# Patient Record
Sex: Male | Born: 2000 | Race: White | Hispanic: No | Marital: Married | Smoking: Never smoker
Health system: Southern US, Community
[De-identification: ages and names within clinical notes are randomized; demographics above are authoritative.]

## PROBLEM LIST (undated history)

## (undated) ENCOUNTER — Emergency Department (HOSPITAL_COMMUNITY): Admission: EM | Payer: No Typology Code available for payment source | Source: Home / Self Care

## (undated) DIAGNOSIS — F32A Depression, unspecified: Secondary | ICD-10-CM

## (undated) DIAGNOSIS — R1110 Vomiting, unspecified: Secondary | ICD-10-CM

## (undated) DIAGNOSIS — G479 Sleep disorder, unspecified: Secondary | ICD-10-CM

## (undated) DIAGNOSIS — F429 Obsessive-compulsive disorder, unspecified: Secondary | ICD-10-CM

## (undated) DIAGNOSIS — G40909 Epilepsy, unspecified, not intractable, without status epilepticus: Secondary | ICD-10-CM

## (undated) DIAGNOSIS — S060XAA Concussion with loss of consciousness status unknown, initial encounter: Secondary | ICD-10-CM

## (undated) DIAGNOSIS — S060X9A Concussion with loss of consciousness of unspecified duration, initial encounter: Secondary | ICD-10-CM

## (undated) DIAGNOSIS — F419 Anxiety disorder, unspecified: Secondary | ICD-10-CM

## (undated) HISTORY — PX: HAND SURGERY: SHX662

## (undated) HISTORY — PX: CIRCUMCISION: SUR203

## (undated) HISTORY — PX: WISDOM TOOTH EXTRACTION: SHX21

---
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
## 2019-03-08 DIAGNOSIS — G40309 Generalized idiopathic epilepsy and epileptic syndromes, not intractable, without status epilepticus: Secondary | ICD-10-CM | POA: Diagnosis present

## 2019-11-12 DIAGNOSIS — F32A Depression, unspecified: Secondary | ICD-10-CM

## 2020-01-05 DIAGNOSIS — F4322 Adjustment disorder with anxiety: Secondary | ICD-10-CM | POA: Insufficient documentation

## 2020-03-30 ENCOUNTER — Other Ambulatory Visit: Payer: Self-pay

## 2020-03-30 ENCOUNTER — Emergency Department (HOSPITAL_COMMUNITY)
Admission: EM | Admit: 2020-03-30 | Discharge: 2020-03-31 | Disposition: A | Discharge status: Home / Self Care | Payer: Medicaid Other | Attending: Emergency Medicine | Admitting: Emergency Medicine

## 2020-03-30 ENCOUNTER — Emergency Department (HOSPITAL_COMMUNITY): Payer: Medicaid Other

## 2020-03-30 DIAGNOSIS — Z79899 Other long term (current) drug therapy: Secondary | ICD-10-CM | POA: Diagnosis not present

## 2020-03-30 DIAGNOSIS — R4182 Altered mental status, unspecified: Secondary | ICD-10-CM | POA: Diagnosis present

## 2020-03-30 DIAGNOSIS — F10929 Alcohol use, unspecified with intoxication, unspecified: Secondary | ICD-10-CM

## 2020-03-30 DIAGNOSIS — R0681 Apnea, not elsewhere classified: Secondary | ICD-10-CM | POA: Diagnosis not present

## 2020-03-30 DIAGNOSIS — R569 Unspecified convulsions: Secondary | ICD-10-CM | POA: Insufficient documentation

## 2020-03-30 DIAGNOSIS — Y907 Blood alcohol level of 200-239 mg/100 ml: Secondary | ICD-10-CM | POA: Diagnosis not present

## 2020-03-30 DIAGNOSIS — F17200 Nicotine dependence, unspecified, uncomplicated: Secondary | ICD-10-CM | POA: Diagnosis not present

## 2020-03-30 LAB — CBC
HCT: 44.2 % (ref 39.0–52.0)
Hemoglobin: 15.1 g/dL (ref 13.0–17.0)
MCH: 31.5 pg (ref 26.0–34.0)
MCHC: 34.2 g/dL (ref 30.0–36.0)
MCV: 92.1 fL (ref 80.0–100.0)
Platelets: 180 10*3/uL (ref 150–400)
RBC: 4.8 MIL/uL (ref 4.22–5.81)
RDW: 11.2 % — ABNORMAL LOW (ref 11.5–15.5)
WBC: 5 10*3/uL (ref 4.0–10.5)
nRBC: 0 % (ref 0.0–0.2)

## 2020-03-30 LAB — COMPREHENSIVE METABOLIC PANEL
ALT: 40 U/L (ref 0–44)
AST: 47 U/L — ABNORMAL HIGH (ref 15–41)
Albumin: 4.3 g/dL (ref 3.5–5.0)
Alkaline Phosphatase: 76 U/L (ref 38–126)
Anion gap: 13 (ref 5–15)
BUN: 7 mg/dL (ref 6–20)
CO2: 25 mmol/L (ref 22–32)
Calcium: 8.7 mg/dL — ABNORMAL LOW (ref 8.9–10.3)
Chloride: 104 mmol/L (ref 98–111)
Creatinine, Ser: 1.05 mg/dL (ref 0.61–1.24)
GFR calc Af Amer: >60 mL/min (ref >60)
GFR calc non Af Amer: >60 mL/min (ref >60)
Glucose, Bld: 111 mg/dL — ABNORMAL HIGH (ref 70–99)
Potassium: 3.7 mmol/L (ref 3.5–5.1)
Sodium: 142 mmol/L (ref 135–145)
Total Bilirubin: 1 mg/dL (ref 0.3–1.2)
Total Protein: 6.4 g/dL — ABNORMAL LOW (ref 6.5–8.1)

## 2020-03-30 LAB — ETHANOL: Alcohol, Ethyl (B): 204 mg/dL — ABNORMAL HIGH (ref <10)

## 2020-03-30 LAB — CBG MONITORING, ED: Glucose-Capillary: 106 mg/dL — ABNORMAL HIGH (ref 70–99)

## 2020-03-30 LAB — VALPROIC ACID LEVEL: Valproic Acid Lvl: 47 ug/mL — ABNORMAL LOW (ref 50.0–100.0)

## 2020-03-30 MED ORDER — SODIUM CHLORIDE 0.9 % IV SOLN: INTRAVENOUS | Status: DC

## 2020-03-30 MED ORDER — LORAZEPAM 2 MG/ML IJ SOLN
INTRAMUSCULAR | Status: AC
  Filled 2020-03-30: qty 1

## 2020-03-30 MED ORDER — ONDANSETRON HCL 4 MG/2ML IJ SOLN
4.0000 mg | Freq: Once | INTRAMUSCULAR | Status: AC
  Administered 2020-03-30: 4 mg via INTRAVENOUS

## 2020-03-30 NOTE — ED Provider Notes (Signed)
[facility] EMERGENCY DEPARTMENT Provider Note   CSN: [id number] Arrival date & time: 03/30/20  2054     History seizure  Albert Gomez is a 19 y.o. male.  HPI   Patient presented to the ED for evaluation of altered mental status and seizures.  Patient has a history of epilepsy, obsessive-compulsive disorder and anxiety.  Per the EMS report, the patient had been drinking some alcohol today.  Apparently when he got home this evening he started seizing.  Apparently this occurred for approximately 45 minutes and there was interval lucidity.  EMS was called because of persistent seizures.  Patient was given 5 mg of Versed.  In the ED he had an episode of vomiting but otherwise has not been able to provide any history.  Past Medical History:  Diagnosis Date  . Anxiety   . Concussion   . Epilepsy (HCC)   . Obsessive-compulsive disorder   . Sleep disorder   . Vomiting     Patient Active Problem List   Diagnosis Date Noted  . Seizure (HCC) 06/03/2018  . Vomiting 06/03/2018  . Elevated lipase   . Acute appendicitis, uncomplicated 11/26/2016  . Problems with learning 09/14/2016  . Insomnia 09/14/2016  . 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 Transient alteration of awareness 02/13/2015  . Anxiety state 02/13/2015    Past Surgical History:  Procedure Laterality Date  . CIRCUMCISION  2001  . HAND SURGERY    . LAPAROSCOPIC APPENDECTOMY N/A 11/26/2016   Procedure: APPENDECTOMY LAPAROSCOPIC;  Surgeon: Kandice Hams, MD;  Location: [facility] OR;  Service: General;  Laterality: N/A;       Family History  Problem Relation Age of Onset  . Lung cancer Paternal Grandmother        Died at 51    Social History   Tobacco Use  . Smoking status: Current Every Day Smoker  . Smokeless tobacco: Never Used  Vaping Use  . Vaping Use: Never assessed  Substance Use Topics  .  Alcohol use: Yes    Alcohol/week: 0.0 standard drinks  . Drug use: Yes    Types: Marijuana    Home Medications Prior to Admission medications   Medication Sig Start Date End Date Taking? Authorizing Provider  ibuprofen (ADVIL,MOTRIN) 400 MG tablet Take 1 tablet (400 mg total) by mouth every 6 (six) hours as needed for moderate pain. 06/04/18   Collene Gobble I, MD  levETIRAcetam (KEPPRA) 250 MG tablet Take 250 mg by mouth 3 (three) times daily. 05/07/18   [provider]  ondansetron (ZOFRAN ODT) 4 MG disintegrating tablet Take 1 tablet (4 mg total) by mouth every 8 (eight) hours as needed for nausea or vomiting. 06/04/18   Collene Gobble I, MD    Allergies    Patient has no known allergies.  Review of Systems   Review of Systems  All other systems reviewed and are negative.   Physical Exam Updated Vital Signs BP (!) 120/39   Pulse 73   Resp 18   SpO2 100%   Physical Exam Vitals and nursing note reviewed.  Constitutional:      General: He is not in acute distress.    Appearance: He is well-developed.  HENT:     Head: Normocephalic and atraumatic.     Right Ear: External ear normal.     Left Ear: External ear normal.  Eyes:     General: No scleral  icterus.       Right eye: No discharge.        Left eye: No discharge.     Conjunctiva/sclera: Conjunctivae normal.  Neck:     Trachea: No tracheal deviation.  Cardiovascular:     Rate and Rhythm: Normal rate and regular rhythm.  Pulmonary:     Effort: Pulmonary effort is normal. No respiratory distress.     Breath sounds: Normal breath sounds. No stridor. No wheezing or rales.  Abdominal:     General: Bowel sounds are normal. There is no distension.     Palpations: Abdomen is soft.     Tenderness: There is no abdominal tenderness. There is no guarding or rebound.  Musculoskeletal:        General: No tenderness.     Cervical back: Neck supple.  Skin:    General: Skin is warm and dry.     Findings: No rash.    Neurological:     Cranial Nerves: No cranial nerve deficit (no facial droop, extraocular movements intact,  ).     Motor: No abnormal muscle tone or seizure activity.     Comments: Patient was not actively seizing during my evaluation, he did look at me, would not answer any questions appeared to be hyperventilating     ED Results / Procedures / Treatments   Labs (all labs ordered are listed, but only abnormal results are displayed) Labs Reviewed  COMPREHENSIVE METABOLIC PANEL - Abnormal; Notable for the following components:      Result Value   Glucose, Bld 111 (*)    Calcium 8.7 (*)    Total Protein 6.4 (*)    AST 47 (*)    All other components within normal limits  CBC - Abnormal; Notable for the following components:   RDW 11.2 (*)    All other components within normal limits  VALPROIC ACID LEVEL - Abnormal; Notable for the following components:   Valproic Acid Lvl 47 (*)    All other components within normal limits  ETHANOL - Abnormal; Notable for the following components:   Alcohol, Ethyl (B) 204 (*)    All other components within normal limits  CBG MONITORING, ED - Abnormal; Notable for the following components:   Glucose-Capillary 106 (*)    All other components within normal limits  LEVETIRACETAM LEVEL  RAPID URINE DRUG SCREEN, HOSP PERFORMED    EKG None  Radiology CT Head Wo Contrast  Result Date: 03/30/2020 CLINICAL DATA:  19 year old male with seizures. EXAM: CT HEAD WITHOUT CONTRAST TECHNIQUE: Contiguous axial images were obtained from the base of the skull through the vertex without intravenous contrast. COMPARISON:  None. FINDINGS: Brain: No evidence of acute infarction, hemorrhage, hydrocephalus, extra-axial collection or mass lesion/mass effect. Vascular: No hyperdense vessel or unexpected calcification. Skull: Normal. Negative for fracture or focal lesion. Sinuses/Orbits: There is a small left maxillary sinus retention cyst or polyp. The remainder of the  visualized paranasal sinuses and mastoid air cells are clear. Other: None IMPRESSION: Normal noncontrast CT of the brain. Electronically Signed   By: Elgie Collard M.D.   On: 03/30/2020 22:19    Procedures Procedures (including critical care time)  Medications Ordered in ED Medications  0.9 %  sodium chloride infusion ( Intravenous New Bag/Given 03/30/20 2130)  LORazepam (ATIVAN) 2 MG/ML injection (  Given 03/30/20 2115)  ondansetron (ZOFRAN) injection 4 mg (4 mg Intravenous Given 03/30/20 2134)    ED Course  I have reviewed the triage vital signs and  the nursing notes.  Pertinent labs & imaging results that were available during my care of the patient were reviewed by me and considered in my medical decision making (see chart for details).  Clinical Course as of Mar 30 2348  Mon Mar 30, 2020  2115 Patient has had periods of apnea (?intentional) followed by episodes of hyperventilation and tachypnea.  O2 saturation has remained 100%.  We will continue to monitor closely   [JK]  2147 Patient is now awake and alert.  Is complaining of nausea vomiting.  Additional Zofran given   [JK]  2244 Head CT without acute findings.   [JK]    Clinical Course User Index [JK] Linwood Dibbles, MD   MDM Rules/Calculators/A&P                          Patient presented to the ED for evaluation of possible seizures.  Patient does have a history of seizure disorder.  In the ED the patient had episodes of nausea vomiting and unresponsiveness.  CT without acute findings.  Significant alcohol intoxication noted.  Likely contributed to sx tonight.  Will monitor until pt is sober.  No signs of recurrent seizure at this time.  Dr Wilkie Aye will follow up on pt. Final Clinical Impression(s) / ED Diagnoses Final diagnoses:  Alcoholic intoxication with complication [facility])    Rx / DC Orders ED Discharge Orders    None       Linwood Dibbles, MD 03/30/20 2349

## 2020-03-30 NOTE — ED Triage Notes (Addendum)
Pt coming in by EMS after having multiple seizures lasting over a span of 45 min. Had admitted to ETOH use today, 2-3 beers with friends [place]. Has hx of seizures and takes meds as prescribed per pt. Given 5mg  of Midazolam by EMS. After administration pt had a period of apnea for approx 10-15 min and had to be bagged. Vitals WDL. [place] and had a tonic clonic seizure not long after arriving to room. Pt originally alert and oriented x4 before having another seizure

## 2020-03-30 NOTE — ED Notes (Signed)
Pt still sleeping comfortably.

## 2020-03-30 NOTE — ED Notes (Signed)
Updated pt's mother again over phone.

## 2020-03-30 NOTE — ED Notes (Addendum)
Pt alert and oriented and developed nausea again and hiccups. MD informed and pt given Zofran. Pt then fell asleep but was unresponsive to painful stimuli. Pupils assessed and glaze deviation noted in both pupils. Pupils now pinpoint. MD informed and given verbal orders to continue to monitor

## 2020-03-30 NOTE — ED Notes (Signed)
Upon arrival pt presented seizure like activity. Given Ativan and then developed a period of apnea. Bagged for approx 3 minutes and then pt began breathing on his own.

## 2020-03-30 NOTE — ED Notes (Signed)
Pt returned from CT. Pt still not responding to verbal stimuli and appears to be sleeping. Vitals WDL

## 2020-03-30 NOTE — ED Notes (Signed)
Updated pt's mother over the phone with pt's approval. Pt states he does not wish to have visitors at the moment

## 2020-03-31 LAB — RAPID URINE DRUG SCREEN, HOSP PERFORMED
Amphetamines: NOT DETECTED
Barbiturates: NOT DETECTED
Benzodiazepines: POSITIVE — AB
Cocaine: NOT DETECTED
Opiates: NOT DETECTED
Tetrahydrocannabinol: NOT DETECTED

## 2020-03-31 MED ORDER — ONDANSETRON HCL 4 MG/2ML IJ SOLN
4.0000 mg | Freq: Once | INTRAMUSCULAR | Status: AC
  Administered 2020-03-31: 4 mg via INTRAVENOUS
  Filled 2020-03-31: qty 2

## 2020-03-31 NOTE — ED Notes (Addendum)
Pt woke up and began vomiting and stated that he did not feel any better. Given Zofran. Pt now asleep in bed. Pt still requesting not to have any visitors back

## 2020-03-31 NOTE — Discharge Instructions (Addendum)
Your seen today for seizure activity.  You were found to be intoxicated.  Intoxication will lower your seizure threshold.  Make sure to take your seizure medications as prescribed.  Avoid excessive drug and alcohol use.

## 2020-03-31 NOTE — ED Provider Notes (Signed)
Patient signed out pending reassessment.  In brief, history of seizure disorder.  Seized earlier this evening.  This is in the setting of heavy alcohol use.  Alcohol level 204.  Reported to have several episodes while in the room of what appeared to be breath-holding spells.  However, vital signs stated stable.  On my recheck, he is awake, alert, oriented.  Reports he generally feels bad but his vital signs have remained normal.  He is ambulatory independently.  There was a message from the secretary that his mother wanted an update.  However, patient requested that his mother not be allowed back.  He is now an adult.  Information was not shared with his mother.  Patient encouraged to abstain from alcohol and drug use.  Continue seizure medications as prescribed.  Clinical Course as of Apr 01 303  Mon Mar 30, 2020  2115 Patient has had periods of apnea (?intentional) followed by episodes of hyperventilation and tachypnea.  O2 saturation has remained 100%.  We will continue to monitor closely   [JK]  2147 Patient is now awake and alert.  Is complaining of nausea vomiting.  Additional Zofran given   [JK]  2244 Head CT without acute findings.   [JK]  Tue Mar 31, 2020  0112 Patient is now awake, alert.  States he feels "bad."  Vital signs are reassuring.  Will normalize and ambulate.   [CH]    Clinical Course User Index [CH] Wilbon Obenchain, Mayer Masker, MD [JK] Linwood Dibbles, MD    After history, exam, and medical workup I feel the patient has been appropriately medically screened and is safe for discharge home. Pertinent diagnoses were discussed with the patient. Patient was given return precautions.  Problem List Items Addressed This Visit      Other   Seizure [facility])    Other Visit Diagnoses    Alcoholic intoxication with complication [facility])    -  Primary        Jarmarcus Wambold, Mayer Masker, MD 03/31/20 [phone]

## 2020-03-31 NOTE — ED Notes (Signed)
Pt woke up for a second and started shivering, asked if he was cold and he shook his head "yes". Given patient another blanket and will continue to monitor

## 2020-03-31 NOTE — ED Notes (Signed)
Called pt's mother Eber Jones again for transport home, no answer. Pt still resting

## 2020-03-31 NOTE — ED Notes (Signed)
Pt alert and oriented, walked around room. Attempted to call mom but she did not pick up the phone. Will attempt again soon.

## 2020-03-31 NOTE — ED Notes (Signed)
Called mother, Eber Jones. No answer

## 2020-03-31 NOTE — ED Notes (Signed)
Please call mom Albert Gomez @ [phone]--Albert Gomez

## 2020-03-31 NOTE — ED Notes (Signed)
Called father and stepmother for transport home, no answer

## 2020-03-31 NOTE — ED Notes (Signed)
Patient verbalizes understanding of discharge instructions. Opportunity for questioning and answers were provided. Armband removed by staff, pt discharged from ED. Wheeled out to lobby  

## 2020-03-31 NOTE — ED Notes (Signed)
Spoke w/pt's mother, and she is on the way to pick him up

## 2020-04-05 LAB — LEVETIRACETAM LEVEL: Levetiracetam Lvl: <1 ug/mL — ABNORMAL LOW (ref 10.0–40.0)

## 2020-11-24 ENCOUNTER — Other Ambulatory Visit: Payer: Self-pay

## 2020-11-24 ENCOUNTER — Encounter (HOSPITAL_COMMUNITY): Payer: Self-pay | Admitting: Registered Nurse

## 2020-11-24 ENCOUNTER — Emergency Department (HOSPITAL_COMMUNITY)
Admission: EM | Admit: 2020-11-24 | Discharge: 2020-11-25 | Disposition: A | Discharge status: Home / Self Care | Payer: Medicaid Other | Attending: Psychiatry | Admitting: Psychiatry

## 2020-11-24 ENCOUNTER — Encounter (HOSPITAL_COMMUNITY): Payer: Self-pay | Admitting: Emergency Medicine

## 2020-11-24 ENCOUNTER — Ambulatory Visit (HOSPITAL_COMMUNITY)
Admission: EM | Admit: 2020-11-24 | Discharge: 2020-11-24 | Disposition: A | Discharge status: Other Acute Inpatient Hospital | Payer: Medicaid Other | Attending: Psychiatry | Admitting: Psychiatry

## 2020-11-24 DIAGNOSIS — F341 Dysthymic disorder: Secondary | ICD-10-CM | POA: Insufficient documentation

## 2020-11-24 DIAGNOSIS — R45851 Suicidal ideations: Secondary | ICD-10-CM | POA: Insufficient documentation

## 2020-11-24 DIAGNOSIS — F332 Major depressive disorder, recurrent severe without psychotic features: Secondary | ICD-10-CM | POA: Diagnosis not present

## 2020-11-24 DIAGNOSIS — Z20822 Contact with and (suspected) exposure to covid-19: Secondary | ICD-10-CM | POA: Diagnosis not present

## 2020-11-24 DIAGNOSIS — Z79899 Other long term (current) drug therapy: Secondary | ICD-10-CM | POA: Insufficient documentation

## 2020-11-24 DIAGNOSIS — R569 Unspecified convulsions: Secondary | ICD-10-CM | POA: Diagnosis not present

## 2020-11-24 DIAGNOSIS — F329 Major depressive disorder, single episode, unspecified: Secondary | ICD-10-CM | POA: Diagnosis present

## 2020-11-24 DIAGNOSIS — F17200 Nicotine dependence, unspecified, uncomplicated: Secondary | ICD-10-CM | POA: Insufficient documentation

## 2020-11-24 DIAGNOSIS — F322 Major depressive disorder, single episode, severe without psychotic features: Secondary | ICD-10-CM | POA: Diagnosis not present

## 2020-11-24 LAB — COMPREHENSIVE METABOLIC PANEL
ALT: 29 U/L (ref 0–44)
AST: 29 U/L (ref 15–41)
Albumin: 4.7 g/dL (ref 3.5–5.0)
Alkaline Phosphatase: 64 U/L (ref 38–126)
Anion gap: 11 (ref 5–15)
BUN: 13 mg/dL (ref 6–20)
CO2: 29 mmol/L (ref 22–32)
Calcium: 9.9 mg/dL (ref 8.9–10.3)
Chloride: 100 mmol/L (ref 98–111)
Creatinine, Ser: 1.01 mg/dL (ref 0.61–1.24)
GFR, Estimated: >60 mL/min (ref >60)
Glucose, Bld: 95 mg/dL (ref 70–99)
Potassium: 3.8 mmol/L (ref 3.5–5.1)
Sodium: 140 mmol/L (ref 135–145)
Total Bilirubin: 0.5 mg/dL (ref 0.3–1.2)
Total Protein: 7.1 g/dL (ref 6.5–8.1)

## 2020-11-24 LAB — CBC
HCT: 45.7 % (ref 39.0–52.0)
Hemoglobin: 16.4 g/dL (ref 13.0–17.0)
MCH: 31.8 pg (ref 26.0–34.0)
MCHC: 35.9 g/dL (ref 30.0–36.0)
MCV: 88.6 fL (ref 80.0–100.0)
Platelets: 193 10*3/uL (ref 150–400)
RBC: 5.16 MIL/uL (ref 4.22–5.81)
RDW: 11 % — ABNORMAL LOW (ref 11.5–15.5)
WBC: 7.4 10*3/uL (ref 4.0–10.5)
nRBC: 0 % (ref 0.0–0.2)

## 2020-11-24 LAB — ETHANOL: Alcohol, Ethyl (B): <10 mg/dL (ref <10)

## 2020-11-24 LAB — RAPID URINE DRUG SCREEN, HOSP PERFORMED
Amphetamines: NOT DETECTED
Barbiturates: NOT DETECTED
Benzodiazepines: POSITIVE — AB
Cocaine: NOT DETECTED
Opiates: NOT DETECTED
Tetrahydrocannabinol: POSITIVE — AB

## 2020-11-24 LAB — SALICYLATE LEVEL: Salicylate Lvl: <7 mg/dL — ABNORMAL LOW (ref 7.0–30.0)

## 2020-11-24 LAB — ACETAMINOPHEN LEVEL: Acetaminophen (Tylenol), Serum: <10 ug/mL — ABNORMAL LOW (ref 10–30)

## 2020-11-24 MED ORDER — DIVALPROEX SODIUM 250 MG PO DR TAB: 500.0000 mg | DELAYED_RELEASE_TABLET | Freq: Two times a day (BID) | ORAL | Status: DC

## 2020-11-24 MED ORDER — TRAZODONE HCL 100 MG PO TABS
100.0000 mg | ORAL_TABLET | Freq: Every evening | ORAL | Status: DC | PRN
  Administered 2020-11-25: 100 mg via ORAL
  Filled 2020-11-24: qty 1

## 2020-11-24 MED ORDER — CLOBAZAM 10 MG PO TABS
20.0000 mg | ORAL_TABLET | Freq: Two times a day (BID) | ORAL | Status: DC
  Administered 2020-11-25: 20 mg via ORAL
  Filled 2020-11-24: qty 2

## 2020-11-24 MED ORDER — DIVALPROEX SODIUM 250 MG PO DR TAB
500.0000 mg | DELAYED_RELEASE_TABLET | Freq: Two times a day (BID) | ORAL | Status: DC
  Administered 2020-11-25: 500 mg via ORAL
  Filled 2020-11-24: qty 2

## 2020-11-24 NOTE — BH Assessment (Addendum)
Patient is a 20 y.o. male with a history of significant untreated depression who presents at the recommendation of his neurologist.  Patient played football in high school and sustained a head injury/concussion and was diagnosed with epilepsy shortly after.  He has been battling depression since this injury 6 years ago, however he has kept this from family, friends and providers.  He reports having suicidal thoughts constantly, that worsen at night.  He has a prior well planned interrupted attempt 1.5 years ago.  He drove to the beach to clear his mind, with hopes this would help.  When there was no relief from symptoms, he bought a rope at [employer] and found a tower.  Just before the attempt, he received a call/text that interrupted the attempt as he saw this as a sign.  He has continued to experience "unbearable" depressive symptoms daily.  He states he thinks of attempting each night, and he worries that he may attempt.  He has been looking into  Medications to overdose on for the least painful option. Patient divulged this information with SW at his neurology clinic, who then recommended pt come in ASAP for assessment.  Patient is unable to reliably affirm his safety at this time.   Patient is EMERGENT

## 2020-11-24 NOTE — ED Notes (Signed)
Rayfield Citizen, mom, [phone] would like an update when available

## 2020-11-24 NOTE — ED Notes (Signed)
Patient wanded by security-[place],RN

## 2020-11-24 NOTE — ED Notes (Signed)
Pt was transported to [facility] via [facility]. Continue to endorse SI. No seizure noted. Safety maintained.

## 2020-11-24 NOTE — ED Notes (Signed)
Called mother Rayfield Citizen and updated her regarding pts plan of care with pts consent.

## 2020-11-24 NOTE — ED Notes (Signed)
Called Clair Gulling, Consulting civil engineer at [employer] with nurse-to-nurse report. Called Non-emergent EMS for transport due to pt being seizure prone and SI with plan. Pt made aware.

## 2020-11-24 NOTE — ED Provider Notes (Signed)
[place] EMERGENCY DEPARTMENT Provider Note   CSN: [id number] Arrival date & time: 11/24/20  1858     History Chief Complaint  Patient presents with  . Medical Clearance    Albert Gomez is a 20 y.o. male.  20 yo M with a chief complaints of depression. Patient went to the behavioral health urgent care and was told that he could not stay there because he has a history of seizures and so was sent here for evaluation. I did feel like he met inpatient criteria. Patient denies any recent issues with his seizures. Denies any missed doses of his medicines. Denies any medical complaint denies cough congestion or fever denies abdominal pain nausea vomiting or diarrhea.  The history is provided by the patient.  Illness Severity:  Moderate Onset quality:  Gradual Duration:  2 days Timing:  Constant Progression:  Worsening Chronicity:  Recurrent Associated symptoms: no abdominal pain, no chest pain, no congestion, no diarrhea, no fever, no headaches, no myalgias, no rash, no shortness of breath and no vomiting        Past Medical History:  Diagnosis Date  . Anxiety   . Concussion   . Epilepsy ([place])   . Obsessive-compulsive disorder   . Sleep disorder   . Vomiting     Patient Active Problem List   Diagnosis Date Noted  . 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 Suicidal ideation 11/24/2020  . Seizure ([place]) 06/03/2018  . Vomiting 06/03/2018  . Elevated lipase   . Acute appendicitis, uncomplicated 60/63/0160  . Problems with learning 09/14/2016  . Insomnia 09/14/2016  . 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 Transient alteration of awareness 02/13/2015  . Anxiety state 02/13/2015    Past Surgical History:  Procedure Laterality Date  . CIRCUMCISION  2001  . HAND SURGERY    . LAPAROSCOPIC APPENDECTOMY N/A  11/26/2016   Procedure: APPENDECTOMY LAPAROSCOPIC;  Surgeon: Stanford Scotland, MD;  Location: [facility] OR;  Service: General;  Laterality: N/A;       Family History  Problem Relation Age of Onset  . Lung cancer Paternal Grandmother        Died at 65    Social History   Tobacco Use  . Smoking status: Current Every Day Smoker  . Smokeless tobacco: Never Used  Substance Use Topics  . Alcohol use: Yes    Alcohol/week: 0.0 standard drinks  . Drug use: Yes    Types: Marijuana    Home Medications Prior to Admission medications   Medication Sig Start Date End Date Taking? Authorizing Provider  cloBAZam (ONFI) 20 MG tablet Take by mouth 2 (two) times daily.   Yes [provider]  divalproex (DEPAKOTE) 500 MG DR tablet Take 500 mg by mouth 2 (two) times daily.   Yes [provider]  traZODone (DESYREL) 50 MG tablet Take 100 mg by mouth at bedtime as needed for sleep.   Yes [provider]    Allergies    Patient has no known allergies.  Review of Systems   Review of Systems  Constitutional: Negative for chills and fever.  HENT: Negative for congestion and facial swelling.   Eyes: Negative for discharge and visual disturbance.  Respiratory: Negative for shortness of breath.   Cardiovascular: Negative for chest pain and palpitations.  Gastrointestinal: Negative for abdominal pain, diarrhea and vomiting.  Musculoskeletal: Negative for arthralgias and myalgias.  Skin: Negative for color change and rash.  Neurological: Negative for tremors, syncope and headaches.  Psychiatric/Behavioral: Positive for dysphoric mood. Negative for confusion.    Physical Exam Updated Vital Signs BP (!) 130/56 (BP Location: Left Arm)   Pulse 92   Temp 98.9 F (37.2 C) (Oral)   Resp 16   SpO2 99%   Physical Exam Vitals and nursing note reviewed.  Constitutional:      Appearance: He is well-developed and well-nourished.  HENT:     Head: Normocephalic and atraumatic.  Eyes:      Extraocular Movements: EOM normal.     Pupils: Pupils are equal, round, and reactive to light.  Neck:     Vascular: No JVD.  Cardiovascular:     Rate and Rhythm: Normal rate and regular rhythm.     Heart sounds: No murmur heard. No friction rub. No gallop.   Pulmonary:     Effort: No respiratory distress.     Breath sounds: No wheezing.  Abdominal:     General: There is no distension.     Tenderness: There is no guarding or rebound.  Musculoskeletal:        General: Normal range of motion.     Cervical back: Normal range of motion and neck supple.  Skin:    Coloration: Skin is not pale.     Findings: No rash.  Neurological:     Mental Status: He is alert and oriented to person, place, and time.  Psychiatric:        Mood and Affect: Mood is depressed.        Behavior: Behavior normal.     ED Results / Procedures / Treatments   Labs (all labs ordered are listed, but only abnormal results are displayed) Labs Reviewed  SARS CORONAVIRUS 2 (TAT 6-24 HRS)  COMPREHENSIVE METABOLIC PANEL  ETHANOL  CBC  RAPID URINE DRUG SCREEN, HOSP PERFORMED  ACETAMINOPHEN LEVEL  SALICYLATE LEVEL    EKG None  Radiology No results found.  Procedures Procedures   Medications Ordered in ED Medications  cloBAZam (ONFI) tablet 20 mg (has no administration in time range)  divalproex (DEPAKOTE) DR tablet 500 mg (has no administration in time range)  traZODone (DESYREL) tablet 100 mg (has no administration in time range)    ED Course  I have reviewed the triage vital signs and the nursing notes.  Pertinent labs & imaging results that were available during my care of the patient were reviewed by me and considered in my medical decision making (see chart for details).    MDM Rules/Calculators/A&P                          20 yo M with a chief complaints of depression. Went to behavioral health urgent care but unfortunately they were unable to accept him due to a history of seizure  disorder. Patient with no medical complaints. I feel he is medically clear. Awaiting psych placement.  The patients results and plan were reviewed and discussed.   Any x-rays performed were independently reviewed by myself.   Differential diagnosis were considered with the presenting HPI.  Medications  cloBAZam (ONFI) tablet 20 mg (has no administration in time range)  divalproex (DEPAKOTE) DR tablet 500 mg (has no administration in time range)  traZODone (DESYREL) tablet 100 mg (has no administration in time range)    Vitals:   11/24/20 1900  BP: (!) 130/56  Pulse: 92  Resp: 16  Temp: 98.9 F (37.2 C)  TempSrc: Oral  SpO2: 99%    Final diagnoses:  Dysthymia     Final Clinical Impression(s) / ED Diagnoses Final diagnoses:  Dysthymia    Rx / DC Orders ED Discharge Orders    None       Deno Etienne, DO 11/24/20 2050

## 2020-11-24 NOTE — ED Triage Notes (Signed)
Patient was picked up from [facility] for medical clearance.  Patient feeling depressed.  No SI/HI.  Patient has previous SI attempts.  Calm and cooperative.  Patient has a history of seizures.  VSS.

## 2020-11-24 NOTE — ED Notes (Signed)
Pt belongings placed in locker number 3 

## 2020-11-24 NOTE — BH Assessment (Signed)
Comprehensive Clinical Assessment (CCA) Note  11/24/2020 Albert Gomez [id number]   Patient is a 20 year old male presenting voluntarily to [facility] for assessment after being referred by his neurologist. Patient is accompanied by his mother, Rayfield Citizen, who waits in lobby and provides collateral information. Patient reports depression since age 30, around the time he was diagnosed with epilepsy, but it continues to worsen to the point now he is contemplating suicide. He states "I think about it everyday." Patient reports 1 prior attempt to hang himself 1.5 years ago. He states he has been researching different medications he could take to overdose. Patient denies HI/AVH. Patient's epilepsy diagnosis seems to be his biggest stressor. He states he went to college to play football but had to leave school due to his epilepsy. He then tried to go to work with his father but he got progressively worsening seizures and had to quit. Patient reports feeling hopeless and worthless. Patient also indicates he has a history of numerous concussions from playing football. Patient states he uses THC to assist with his anxiety and depression, however, once he is no longer high he feels the same.   Collateral information obtained from patient's mother, Steven Basso: Patient began to experience depression in high school but not to this severity. She states patient feels worthless because he had to leave school and cannot work. She states patient isolates in his room and does not enjoy activities he once did. Patient uses THC to help cope with his depression and anxiety. She is agreeable that patient would benefit from in patient treatment.  Disposition: Shuvon Rankin, NP recommends in patient treatment. Patient has been referred to [facility]. He will be transferred to ED to await placement due to history of seizure disorder.   Chief Complaint:  Chief Complaint  Patient presents with  . Urgent Emergent Eval   Visit  Diagnosis: F33.2 MDD, recurrent, severe    F41.1 GAD   CCA Screening, Triage and Referral (STR)  Patient Reported Information How did you hear about [place]? Other (Comment) (Neurologist)  Referral name: No data recorded Referral phone number: No data recorded  Whom do you see for routine medical problems? No data recorded Practice/Facility Name: No data recorded Practice/Facility Phone Number: No data recorded Name of Contact: No data recorded Contact Number: No data recorded Contact Fax Number: No data recorded Prescriber Name: No data recorded Prescriber Address (if known): No data recorded  What Is the Reason for Your Visit/Call Today? suicidal ideation  How Long Has This Been Causing You Problems? > than 6 months  What Do You Feel Would Help You the Most Today? Other (Comment) (hospitalization)   Have You Recently Been in Any Inpatient Treatment (Hospital/Detox/Crisis Center/28-Day Program)? No  Name/Location of Program/Hospital:No data recorded How Long Were You There? No data recorded When Were You Discharged? No data recorded  Have You Ever Received Services From [facility] Before? No  Who Do You See at [facility]? No data recorded  Have You Recently Had Any Thoughts About Hurting Yourself? Yes  Are You Planning to Commit Suicide/Harm Yourself At This time? Yes   Have you Recently Had Thoughts About Hurting Someone Karolee Ohs? No  Explanation: No data recorded  Have You Used Any Alcohol or Drugs in the Past 24 Hours? No  How Long Ago Did You Use Drugs or Alcohol? No data recorded What Did You Use and How Much? No data recorded  Do You Currently Have a Therapist/Psychiatrist? No  Name of Therapist/Psychiatrist:  No data recorded  Have You Been Recently Discharged From Any Office Practice or Programs? No  Explanation of Discharge From Practice/Program: No data recorded    CCA Screening Triage Referral Assessment Type of Contact: Face-to-Face  Is this  Initial or Reassessment? No data recorded Date Telepsych consult ordered in [facility]:  No data recorded Time Telepsych consult ordered in [facility]:  No data recorded  Patient Reported Information Reviewed? Yes  Patient Left Without Being Seen? No data recorded Reason for Not Completing Assessment: No data recorded  Collateral Involvement: mother- Rayfield Citizen   Does Patient Have a Automotive engineer Guardian? No data recorded Name and Contact of Legal Guardian: No data recorded If Minor and Not Living with Parent(s), Who has Custody? No data recorded Is CPS involved or ever been involved? Never  Is APS involved or ever been involved? Never   Patient Determined To Be At Risk for Harm To Self or Others Based on Review of Patient Reported Information or Presenting Complaint? Yes, for Self-Harm  Method: No data recorded Availability of Means: No data recorded Intent: No data recorded Notification Required: No data recorded Additional Information for Danger to Others Potential: No data recorded Additional Comments for Danger to Others Potential: No data recorded Are There Guns or Other Weapons in Your Home? No data recorded Types of Guns/Weapons: No data recorded Are These Weapons Safely Secured?                            No data recorded Who Could Verify You Are Able To Have These Secured: No data recorded Do You Have any Outstanding Charges, Pending Court Dates, Parole/Probation? No data recorded Contacted To Inform of Risk of Harm To Self or Others: Family/Significant Other:   Location of Assessment: [facility]   Does Patient Present under Involuntary Commitment? No  IVC Papers Initial File Date: No data recorded  [place] of Residence: [place]   Patient Currently Receiving the Following Services: Not Receiving Services   Determination of Need: Emergent (2 hours)   Options For Referral: Inpatient Hospitalization     CCA Biopsychosocial Intake/Chief  Complaint:  NA  Current Symptoms/Problems: NA   Patient Reported Schizophrenia/Schizoaffective Diagnosis in Past: No   Strengths: NA  Preferences: NA  Abilities: NA   Type of Services Patient Feels are Needed: NA   Initial Clinical Notes/Concerns: NA   Mental Health Symptoms Depression:  Change in energy/activity; Difficulty Concentrating; Fatigue; Hopelessness; Increase/decrease in appetite; Irritability; Sleep (too much or little); Tearfulness; Weight gain/loss; Worthlessness   Duration of Depressive symptoms: Greater than two weeks   Mania:  None   Anxiety:   Worrying; Tension; Sleep; Difficulty concentrating; Fatigue; Irritability; Restlessness   Psychosis:  None   Duration of Psychotic symptoms: No data recorded  Trauma:  None   Obsessions:  None   Compulsions:  None   Inattention:  None   Hyperactivity/Impulsivity:  N/A   Oppositional/Defiant Behaviors:  N/A   Emotional Irregularity:  N/A   Other Mood/Personality Symptoms:  No data recorded   Mental Status Exam Appearance and self-care  Stature:  Average   Weight:  Average weight   Clothing:  Neat/clean   Grooming:  Normal   Cosmetic use:  None   Posture/gait:  Normal   Motor activity:  Not Remarkable   Sensorium  Attention:  Normal   Concentration:  Normal   Orientation:  X5   Recall/memory:  Normal   Affect and  Mood  Affect:  Appropriate; Flat   Mood:  Depressed   Relating  Eye contact:  Fleeting   Facial expression:  Depressed   Attitude toward examiner:  Cooperative   Thought and Language  Speech flow: Soft   Thought content:  Appropriate to Mood and Circumstances   Preoccupation:  None   Hallucinations:  None   Organization:  No data recorded  Affiliated Computer ServicesExecutive Functions  Fund of Knowledge:  Good   Intelligence:  Average   Abstraction:  Normal   Judgement:  Impaired   Reality Testing:  Realistic   Insight:  Fair   Decision Making:  Normal   Social  Functioning  Social Maturity:  Isolates   Social Judgement:  Normal   Stress  Stressors:  Illness   Coping Ability:  Deficient supports   Skill Deficits:  Interpersonal   Supports:  Family; Friends/Service system     Religion: Religion/Spirituality Are You A Religious Person?: No  Leisure/Recreation: Leisure / Recreation Do You Have Hobbies?: No  Exercise/Diet: Exercise/Diet Do You Exercise?: Yes What Type of Exercise Do You Do?: Weight Training,Run/Walk How Many Times a Week Do You Exercise?: 4-5 times a week Have You Gained or Lost A Significant Amount of Weight in the Past Six Months?: No Do You Follow a Special Diet?: No Do You Have Any Trouble Sleeping?: Yes Explanation of Sleeping Difficulties: reports sleeping a few hours at a time- total of 6 hours per night   CCA Employment/Education Employment/Work Situation: Employment / Work Situation Employment situation: Unemployed Patient's job has been impacted by current illness: No What is the longest time patient has a held a job?: 2 months 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 Has patient ever been in the Eli Lilly and Companymilitary?: No  Education: Education Is Patient Currently Attending School?: No Last Grade Completed: 12 Name of High School: NA Did Garment/textile technologistYou Graduate From [employer] School?: Yes Did Theme park managerYou Attend College?: Yes What Type of College Degree Do you Have?: did not complete due to epilepsy Did You Attend Graduate School?: No Did You Have An Individualized Education Program (IIEP): No Did You Have Any Difficulty At School?: No Patient's Education Has Been Impacted by Current Illness: No   CCA Family/Childhood History Family and Relationship History: Family history Marital status: Single Are you sexually active?: Yes What is your sexual orientation?: heterosexual Has your sexual activity been affected by drugs, alcohol, medication, or emotional stress?: NA Does patient have children?:  No  Childhood History:  Childhood History By whom was/is the patient raised?: Mother/father and step-parent Additional childhood history information: NA Description of patient's relationship with caregiver when they were a child: close/supportive Patient's description of current relationship with people who raised him/her: close/supportive How were you disciplined when you got in trouble as a child/adolescent?: verbal Does patient have siblings?: Yes Number of Siblings: 3 Description of patient's current relationship with siblings: states thay have a good relationship Did patient suffer any verbal/emotional/physical/sexual abuse as a child?: No Did patient suffer from severe childhood neglect?: No Has patient ever been sexually abused/assaulted/raped as an adolescent or adult?: No Was the patient ever a victim of a crime or a disaster?: No Witnessed domestic violence?: No Has patient been affected by domestic violence as an adult?: No  Child/Adolescent Assessment:     CCA Substance Use Alcohol/Drug Use: Alcohol / Drug Use Pain Medications: see MAR Prescriptions: see MAR Over the Counter: see MAR History of alcohol / drug use?: Yes Substance #1 Name  of Substance 1: THC 1 - Age of First Use: teens 1 - Amount (size/oz): varies 1 - Frequency: a few times per week 1 - Duration: UTA 1 - Last Use / Amount: 3/1 1 - Method of Aquiring: UTA 1- Route of Use: smoke                       ASAM's:  Six Dimensions of Multidimensional Assessment  Dimension 1:  Acute Intoxication and/or Withdrawal Potential:   Dimension 1:  Description of individual's past and current experiences of substance use and withdrawal: current use  Dimension 2:  Biomedical Conditions and Complications:   Dimension 2:  Description of patient's biomedical conditions and  complications: epilepsy  Dimension 3:  Emotional, Behavioral, or Cognitive Conditions and Complications:  Dimension 3:  Description of  emotional, behavioral, or cognitive conditions and complications: severe depression/ suicidal ideation  Dimension 4:  Readiness to Change:  Dimension 4:  Description of Readiness to Change criteria: contemplative  Dimension 5:  Relapse, Continued use, or Continued Problem Potential:  Dimension 5:  Relapse, continued use, or continued problem potential critiera description: contemplating stopping  Dimension 6:  Recovery/Living Environment:  Dimension 6:  Recovery/Iiving environment criteria description: lives with mother, safe, stable  ASAM Severity Score: ASAM's Severity Rating Score: 8  ASAM Recommended Level of Treatment: ASAM Recommended Level of Treatment: Level I Outpatient Treatment   Substance use Disorder (SUD) Substance Use Disorder (SUD)  Checklist Symptoms of Substance Use: Evidence of tolerance,Presence of craving or strong urge to use,Substance(s) often taken in larger amounts or over longer times than was intended  Recommendations for Services/Supports/Treatments: Recommendations for Services/Supports/Treatments Recommendations For Services/Supports/Treatments: Inpatient Hospitalization  DSM5 Diagnoses: Patient Active Problem List   Diagnosis Date Noted  . MDD (major depressive disorder), recurrent severe, without psychosis (HCC) 11/24/2020  . Suicidal ideation 11/24/2020  . Seizure ([facility]) 06/03/2018  . Vomiting 06/03/2018  . Elevated lipase   . Acute appendicitis, uncomplicated 11/26/2016  . Problems with learning 09/14/2016  . Insomnia 09/14/2016  . 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 Transient alteration of awareness 02/13/2015  . Anxiety state 02/13/2015    Patient Centered Plan: Patient is on the following Treatment Plan(s):    Referrals to Alternative Service(s): Referred to Alternative Service(s):   Place:   Date:   Time:    Referred to Alternative Service(s):    Place:   Date:   Time:    Referred to Alternative Service(s):   Place:   Date:   Time:    Referred to Alternative Service(s):   Place:   Date:   Time:     Celedonio Miyamoto, LCSW

## 2020-11-24 NOTE — ED Notes (Signed)
Still waiting on med Clobazam to be verified by pharmacy.

## 2020-11-24 NOTE — ED Provider Notes (Signed)
[facility] Medical Screening Exam  Patient Name: Albert Gomez MRN: [id number] Date of Evaluation: 11/24/20 Chief Complaint:   Diagnosis:  Final diagnoses:  MDD (major depressive disorder), recurrent severe, without psychosis (HCC)  Suicidal ideation    History of Present illness: Albert Gomez is a 20 y.o. male patient presented to [facility] as a walk in accompanied by his mother with complaints of worsening depression and suicidal ideation  Albert Gomez, 20 y.o., male patient seen face to face by this provider, consulted with Dr. Nelly Rout; and chart reviewed on 11/24/20.  On evaluation Albert Gomez reports he has a chronic history of depression.  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  States he has had suicidal thoughts on and off for the last year and half and attempted to hang himself last year but was stopped by a friend.  Patient states that current suicidal thoughts with no intent because he doesn't want to die and that he feels that it is selfish; but states he has thought of plans.  Patient is unable to contract for safety.   During evaluation Albert Gomez is sitting upright in chair in no acute distress.  He is alert, oriented x 4, calm and cooperative.  His mood is depressed with congruent affect.  He does not appear to be responding to internal/external stimuli or delusional thoughts.  Patient denies homicidal ideation, psychosis, and paranoia.  Patient answered question appropriately.    Patient recommend for inpatient psychiatric treatment.  Patient sent to [facility] ED related seizure history and unable to accept for continuous assessment unit related to unable to admit or accept patient to [facility] with seizure history.       Psychiatric Specialty Exam  Presentation  General Appearance:Appropriate for Environment  Eye Contact:Fair  Speech:Clear and Coherent; Normal Rate  Speech Volume:Normal  Handedness:Right   Mood and Affect  Mood:Depressed; Dysphoric  Affect:Congruent; Depressed   Thought Process  Thought Processes:Coherent; Goal Directed  Descriptions of Associations:Intact  Orientation:Full (Time, Place and Person)  Thought Content:WDL  Hallucinations:None  Ideas of Reference:None  Suicidal Thoughts:Yes, Passive Without Intent (Patient states he has thought of plan but no intent.  States that he tried to hang himself 1.5 yr ago but was stopped by a friend.  Patient is unable to contract for safety if he is discharged home)  Homicidal Thoughts:No   Sensorium  Memory:Immediate Good; Recent Good  Judgment:Intact  Insight:Present   Executive Functions  Concentration:Good  Attention Span:Good  Recall:Good  Fund of Knowledge:Good  Language:Good   Psychomotor Activity  Psychomotor Activity:Normal   Assets  Assets:Communication Skills; Desire for Improvement; Financial Resources/Insurance; Housing; Resilience; Social Support   Sleep  Sleep:Good  Number of hours: No data recorded  Nutritional Assessment (For [facility] and [facility] admissions only) Has the patient had a weight loss or gain of 10 pounds or more in the last 3 months?: No Has the patient had a decrease in food intake/or appetite?: No Does the patient have dental problems?: No Does the patient have eating habits or behaviors that may be indicators of an eating disorder including binging or inducing vomiting?: No Has the patient recently lost weight without trying?: No Has the patient been eating poorly because of a decreased appetite?: No Malnutrition  Screening Tool Score: 0    Physical Exam: Physical Exam Vitals and nursing note reviewed. Exam conducted with a chaperone present.  Constitutional:       General: He is not in acute distress.    Appearance: Normal appearance. He is not ill-appearing.  HENT:     Head: Normocephalic.  Cardiovascular:     Rate and Rhythm: Normal rate.  Pulmonary:     Effort: Pulmonary effort is normal.  Musculoskeletal:        General: Normal range of motion.     Cervical back: Normal range of motion.  Skin:    General: Skin is warm and dry.  Neurological:     Mental Status: He is alert and oriented to person, place, and time.  Psychiatric:        Attention and Perception: Attention and perception normal. He does not perceive auditory or visual hallucinations.        Mood and Affect: Mood is anxious and depressed.        Speech: Speech normal.        Behavior: Behavior normal. Behavior is cooperative.        Thought Content: Thought content is not paranoid or delusional. Thought content includes suicidal ideation. Thought content does not include homicidal ideation.        Cognition and Memory: Cognition and memory normal.        Judgment: Judgment normal.    Review of Systems  Constitutional: Negative.   HENT: Negative.   Eyes: Negative.   Respiratory: Negative.   Cardiovascular: Negative.   Gastrointestinal: Negative.   Genitourinary: Negative.   Musculoskeletal: Negative.   Skin: Negative.   Neurological: Negative.   Endo/Heme/Allergies: Negative.   Psychiatric/Behavioral: Positive for depression and suicidal ideas. Negative for hallucinations. The patient is nervous/anxious.    Blood pressure 137/71, pulse 62, temperature 98.6 F (37 C), temperature source Oral, resp. rate 16, SpO2 98 %. There is no height or weight on file to calculate BMI.  Musculoskeletal: Strength & Muscle Tone: within normal limits Gait & Station: normal Patient leans: N/A   [facility] MSE Discharge Disposition for Follow up and Recommendations: Based on my evaluation I certify that psychiatric inpatient services furnished can reasonably be expected to improve the  patient's condition which I recommend transfer to an appropriate accepting facility.   Spoke with Tanda Rockers, PA informed that patient has seizure history and unable to admit to [facility].  Patient recommended for inpatient psychiatric treatment and would be transferring to [facility] ED until appropriate bed is found.     Alan Drummer, NP 11/24/2020, 5:33 PM

## 2020-11-24 NOTE — [facility] Counselor (Signed)
Patient gives consent for his mother, Sy Saintjean, to be involved in his care. Her phone number is [phone].

## 2020-11-25 ENCOUNTER — Other Ambulatory Visit: Payer: Self-pay | Admitting: Psychiatric/Mental Health

## 2020-11-25 ENCOUNTER — Encounter (HOSPITAL_COMMUNITY): Payer: Self-pay | Admitting: Registered Nurse

## 2020-11-25 ENCOUNTER — Inpatient Hospital Stay (HOSPITAL_COMMUNITY)
Admission: AD | Admit: 2020-11-25 | Discharge: 2020-11-28 | DRG: 885 | Disposition: A | Discharge status: Home / Self Care | Payer: Medicaid Other | Source: Ambulatory Visit | Attending: Emergency Medicine | Admitting: Emergency Medicine

## 2020-11-25 DIAGNOSIS — F1994 Other psychoactive substance use, unspecified with psychoactive substance-induced mood disorder: Secondary | ICD-10-CM | POA: Diagnosis present

## 2020-11-25 DIAGNOSIS — F332 Major depressive disorder, recurrent severe without psychotic features: Secondary | ICD-10-CM | POA: Diagnosis present

## 2020-11-25 DIAGNOSIS — F411 Generalized anxiety disorder: Secondary | ICD-10-CM | POA: Diagnosis present

## 2020-11-25 DIAGNOSIS — R45851 Suicidal ideations: Secondary | ICD-10-CM

## 2020-11-25 DIAGNOSIS — F429 Obsessive-compulsive disorder, unspecified: Secondary | ICD-10-CM | POA: Diagnosis present

## 2020-11-25 DIAGNOSIS — F341 Dysthymic disorder: Secondary | ICD-10-CM | POA: Diagnosis not present

## 2020-11-25 DIAGNOSIS — F17200 Nicotine dependence, unspecified, uncomplicated: Secondary | ICD-10-CM | POA: Diagnosis present

## 2020-11-25 DIAGNOSIS — G478 Other sleep disorders: Secondary | ICD-10-CM | POA: Diagnosis present

## 2020-11-25 DIAGNOSIS — G40409 Other generalized epilepsy and epileptic syndromes, not intractable, without status epilepticus: Secondary | ICD-10-CM | POA: Diagnosis present

## 2020-11-25 DIAGNOSIS — F1220 Cannabis dependence, uncomplicated: Secondary | ICD-10-CM | POA: Diagnosis present

## 2020-11-25 DIAGNOSIS — F1010 Alcohol abuse, uncomplicated: Secondary | ICD-10-CM | POA: Diagnosis present

## 2020-11-25 DIAGNOSIS — Z820 Family history of epilepsy and other diseases of the nervous system: Secondary | ICD-10-CM | POA: Diagnosis not present

## 2020-11-25 DIAGNOSIS — G4700 Insomnia, unspecified: Secondary | ICD-10-CM | POA: Diagnosis present

## 2020-11-25 DIAGNOSIS — F32A Depression, unspecified: Secondary | ICD-10-CM | POA: Diagnosis not present

## 2020-11-25 DIAGNOSIS — G40309 Generalized idiopathic epilepsy and epileptic syndromes, not intractable, without status epilepticus: Secondary | ICD-10-CM | POA: Diagnosis present

## 2020-11-25 DIAGNOSIS — Z801 Family history of malignant neoplasm of trachea, bronchus and lung: Secondary | ICD-10-CM

## 2020-11-25 DIAGNOSIS — R4681 Obsessive-compulsive behavior: Secondary | ICD-10-CM | POA: Diagnosis present

## 2020-11-25 HISTORY — DX: Depression, unspecified: F32.A

## 2020-11-25 LAB — SARS CORONAVIRUS 2 (TAT 6-24 HRS): SARS Coronavirus 2: NEGATIVE

## 2020-11-25 LAB — VALPROIC ACID LEVEL: Valproic Acid Lvl: 64 ug/mL (ref 50.0–100.0)

## 2020-11-25 MED ORDER — MAGNESIUM HYDROXIDE 400 MG/5ML PO SUSP: 30.0000 mL | Freq: Every day | ORAL | Status: DC | PRN

## 2020-11-25 MED ORDER — CLOBAZAM 10 MG PO TABS: 20.0000 mg | ORAL_TABLET | Freq: Two times a day (BID) | ORAL | Status: DC

## 2020-11-25 MED ORDER — HYDROXYZINE HCL 25 MG PO TABS
25.0000 mg | ORAL_TABLET | Freq: Three times a day (TID) | ORAL | Status: DC | PRN
  Administered 2020-11-25 – 2020-11-27 (×4): 25 mg via ORAL
  Filled 2020-11-25 (×4): qty 1

## 2020-11-25 MED ORDER — DIVALPROEX SODIUM 500 MG PO DR TAB
500.0000 mg | DELAYED_RELEASE_TABLET | Freq: Two times a day (BID) | ORAL | Status: DC
  Administered 2020-11-25 – 2020-11-28 (×6): 500 mg via ORAL
  Filled 2020-11-25 (×12): qty 1

## 2020-11-25 MED ORDER — TRAZODONE HCL 100 MG PO TABS
100.0000 mg | ORAL_TABLET | Freq: Every evening | ORAL | Status: DC | PRN
  Administered 2020-11-25: 100 mg via ORAL
  Filled 2020-11-25: qty 1

## 2020-11-25 MED ORDER — LORAZEPAM 1 MG PO TABS
1.0000 mg | ORAL_TABLET | Freq: Once | ORAL | Status: AC
  Administered 2020-11-25: 1 mg via ORAL
  Filled 2020-11-25: qty 1

## 2020-11-25 MED ORDER — ONDANSETRON 4 MG PO TBDP
4.0000 mg | ORAL_TABLET | Freq: Two times a day (BID) | ORAL | Status: DC
  Administered 2020-11-25 – 2020-11-26 (×2): 4 mg via ORAL
  Filled 2020-11-25 (×9): qty 1

## 2020-11-25 MED ORDER — ALUM & MAG HYDROXIDE-SIMETH 200-200-20 MG/5ML PO SUSP: 30.0000 mL | ORAL | Status: DC | PRN

## 2020-11-25 MED ORDER — ONDANSETRON 4 MG PO TBDP
4.0000 mg | ORAL_TABLET | Freq: Once | ORAL | Status: AC
  Administered 2020-11-25: 4 mg via ORAL
  Filled 2020-11-25: qty 1

## 2020-11-25 MED ORDER — ACETAMINOPHEN 325 MG PO TABS: 650.0000 mg | ORAL_TABLET | Freq: Four times a day (QID) | ORAL | Status: DC | PRN

## 2020-11-25 MED ORDER — LORAZEPAM 1 MG PO TABS
2.0000 mg | ORAL_TABLET | Freq: Four times a day (QID) | ORAL | Status: DC | PRN
  Administered 2020-11-25: 2 mg via ORAL
  Filled 2020-11-25: qty 2

## 2020-11-25 MED ORDER — DIVALPROEX SODIUM 500 MG PO DR TAB
500.0000 mg | DELAYED_RELEASE_TABLET | Freq: Two times a day (BID) | ORAL | Status: DC
  Filled 2020-11-25 (×4): qty 1

## 2020-11-25 MED ORDER — LORAZEPAM 2 MG/ML IJ SOLN: 2.0000 mg | Freq: Four times a day (QID) | INTRAMUSCULAR | Status: DC | PRN

## 2020-11-25 MED ORDER — CLOBAZAM 10 MG PO TABS
20.0000 mg | ORAL_TABLET | Freq: Two times a day (BID) | ORAL | Status: DC
  Administered 2020-11-25 – 2020-11-28 (×6): 20 mg via ORAL
  Filled 2020-11-25 (×6): qty 2

## 2020-11-25 NOTE — ED Notes (Signed)
Voluntary Admission and Consent Form faxed to [facility]

## 2020-11-25 NOTE — ED Notes (Signed)
Breakfast Ordered 

## 2020-11-25 NOTE — ED Notes (Signed)
Pt while on phone Pt said his father was on his way to get him because he can not be kept against his will.

## 2020-11-25 NOTE — Progress Notes (Signed)
Psychoeducational Group Note  Date:  11/25/2020 Time:  2057  Group Topic/Focus:  Wrap-Up Group:   The focus of this group is to help patients review their daily goal of treatment and discuss progress on daily workbooks.  Participation Level: Did Not Attend  Participation Quality:  Not Applicable  Affect:  Not Applicable  Cognitive:  Not Applicable  Insight:  Not Applicable  Engagement in Group: Not Applicable  Additional Comments:  The patient did not attend group this evening.   Hazle Coca S 11/25/2020, 8:57 PM

## 2020-11-25 NOTE — ED Notes (Signed)
Pt's Mother called to update her on plan of treatment and transfer to [facility].

## 2020-11-25 NOTE — ED Notes (Signed)
Patient sitting straight up in bed rocking and stated to staff he feels "weird"; pt states he feels nauseous; Pt c/o of not being able to sleep despite receiving his trazodone; EDP notified and at [facility]

## 2020-11-25 NOTE — H&P (Signed)
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  Patient was seen and interviewed this afternoon in his room by the MD.  The patient is a 20 year old male with a history of seizure disorder, cannabis use as well as prior diagnosis of depression who presented at the recommendation of his neurologist to [facility] as a walk-in for complaints of worsening depression and suicidal ideation.  The patient states that at epilepsy clinic he was seen and "I asked for antidepressant medication and they were taking me seriously."  He presented to [facility] in an attempt to get medication for his depression.  The patient is a little tangential when recounting the history.  He reports that over the last 2 to 3 months he has been smoking marijuana while awake 24 hours a day and got to the point where "I felt like I had no emotion."  Patient states that over the 2 to 3 months of smoking marijuana his mood has worsened.  Symptoms include sad mood, anhedonia, decreased concentration, decreased appetite, feelings of worthlessness, feelings of guilt, thoughts that it would be easier not to be alive, problems with his memory.    On interview today, the patient denies any current wishes not to be alive or suicidal intent preparation or plan.  He states that he only stated that he might take pills because he was asked upon evaluation at [facility] what he would do if wishes not  to be alive intensified and he did want to act on them.  Patient states he also had problems sleeping which were alleviated only by use of marijuana and that trazodone was not effective.  The patient denies ever having homicidal or assaultive ideation past or present.  He states that before his visit to the epilepsy clinic, the patient and his mother were talking about setting up therapy online.  Patient states that he got emotional when talking to the social worker in epilepsy clinic and that is when she referred him for urgent behavioral health evaluation.  The patient states that he believes his depressive symptoms correlate with his heavy cannabis use.  When he uses cannabis depressive symptoms worsen.  He reports that he first had depressive symptoms at the age of 49 or 66 which also correlates with his use of cannabis.  He denies any paranoia, auditory or visual hallucinations, referential thinking past or present.  He does report some anxiety and worries about bills, career decisions, whether or not he will be successful.  He states that at times when he tries to go to sleep he cannot turn his thoughts off.  He denies any symptoms meeting criteria for a manic or hypomanic episode.  Patient denies any access to firearm or other weapon.  Patient reports heavy use of marijuana over the last 2 to 3 months with use approximately 24 hours a day while awake.  He reports onset of social use at age 54-1/21 years old.  He began to use marijuana consistently at age 19.  He reports 1 prior inpatient nonpsychiatric admission to [facility] at the age of  17 for sequela of smoking marijuana that had high THC content.  Patient makes mention of receiving a diagnosis of cannabis hyperemesis syndrome at that time.  The patient denies current use of alcohol.  He reports he last had any alcohol approximately 6 months ago.  He states that alcohol use causes seizures so he stopped using it 6 to 12 months ago.  Prior to that time the  most he drank was at age 30 during which she would consume 3-4 shots per weekend.  He denies a history of cocaine, heroin, ecstasy or other drug use.  He denies vaping or using tobacco products.  He drinks 1 cup of caffeinated coffee per day he denies ever participating in a substance use disorder treatment program.  Associated Signs/Symptoms: Depression Symptoms:  depressed mood, anhedonia, insomnia, fatigue, feelings of worthlessness/guilt, difficulty concentrating, anxiety, decreased appetite, Duration of Depression Symptoms: Greater than two weeks  (Hypo) Manic Symptoms:  none Anxiety Symptoms:  Excessive Worry, Psychotic Symptoms:  none Duration of Psychotic Symptoms: No data recorded PTSD Symptoms: Negative Total Time spent with patient: 1.5 hours  Past Psychiatric History: Patient reports previously seeing a psychiatrist for anxiety.  States last visit with psychiatrist [name]) was about a year ago.  He was referred to the psychiatrist through the child epilepsy clinic.  He reports he was prescribed Klonopin in the past for anxiety.  He denies any other psychiatric medications or other psychiatric diagnoses.  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  The patient denies any history of psychotic symptoms or manic or hypomanic symptoms.  He reports past suicidal ideation and states that when he was on Keppra for his seizure disorder approximately 3 to 4 years ago he purchased a rope and climbed up with plans to hang himself but received a phone call and did not proceed with the attempted hanging.  He denies other near attempts or suicide attempts.  Is the patient at risk to self? Yes.    Has the patient been a risk to self in the  past 6 months? No.  Has the patient been a risk to self within the distant past? Yes.    Is the patient a risk to others? No.  Has the patient been a risk to others in the past 6 months? No.  Has the patient been a risk to others within the distant past? No.   Prior Inpatient Therapy:   Prior Outpatient Therapy:    Alcohol Screening: Patient refused Alcohol Screening Tool: Yes 1. How often do you have a drink containing alcohol?: Monthly or less 2. How many drinks containing alcohol do you have on a typical day when you are drinking?: 1 or 2 3. How often do you have six or more drinks on one occasion?: Less than monthly AUDIT-C Score: 2 4. How often during the last year have you found that you were not able to stop drinking once you had started?: Never 5. How often during the last year have you failed to do what was normally expected from you because of drinking?: Never 6. How often during the last year have you needed a first drink in the morning to get yourself going after a heavy drinking session?: Never 7. How often during the last year have you had a feeling of guilt of remorse  after drinking?: Never 8. How often during the last year have you been unable to remember what happened the night before because you had been drinking?: Never 9. Have you or someone else been injured as a result of your drinking?: No 10. Has a relative or friend or a doctor or another health worker been concerned about your drinking or suggested you cut down?: No Alcohol Use Disorder Identification Test Final Score (AUDIT): 2 Alcohol Brief Interventions/Follow-up: Brief Advice ("I last drank a beer 7 months ago") Substance Abuse History in the last 12 months:  Yes.   Consequences of Substance Abuse: Patient reports worsening symptoms of depression during the time he has been using cannabis over the last 2 to 3 months.  He reports prior episodes of depression that correlate with heavy cannabis use prior to that  time. Previous Psychotropic Medications: Yes  Psychological Evaluations: No  Past Medical History:  Past Medical History:  Diagnosis Date  . Anxiety   . Concussion   . Depression   . Epilepsy (HCC)   . Obsessive-compulsive disorder   . Sleep disorder   . Vomiting     Past Surgical History:  Procedure Laterality Date  . CIRCUMCISION  2001  . HAND SURGERY    . LAPAROSCOPIC APPENDECTOMY N/A 11/26/2016   Procedure: APPENDECTOMY LAPAROSCOPIC;  Surgeon: Kandice Hams, MD;  Location: [facility] OR;  Service: General;  Laterality: N/A;   Family History:  Family History  Problem Relation Age of Onset  . Lung cancer Paternal Grandmother        Died at 73   Family Psychiatric  History:  History of psychiatric problems in family members - patient denies History of suicide attempt in family members - patient denies History of seizure disorder in family members - yes; in mother, maternal uncle, maternal aunt and maternal cousin   Tobacco Screening: Have you used any form of tobacco in the last 30 days? (Cigarettes, Smokeless Tobacco, Cigars, and/or Pipes): No ("I only smoke marijuana".) Social History:  Social History   Substance and Sexual Activity  Alcohol Use Yes  . Alcohol/week: 0.0 standard drinks     Social History   Substance and Sexual Activity  Drug Use Yes  . Types: Marijuana    Additional Social History:                           Allergies:  No Known Allergies Lab Results:  Results for orders placed or performed during the hospital encounter of 11/24/20 (from the past 48 hour(s))  Rapid urine drug screen (hospital performed)     Status: Abnormal   Collection Time: 11/24/20  7:20 PM  Result Value Ref Range   Opiates NONE DETECTED NONE DETECTED   Cocaine NONE DETECTED NONE DETECTED   Benzodiazepines POSITIVE (A) NONE DETECTED   Amphetamines NONE DETECTED NONE DETECTED   Tetrahydrocannabinol POSITIVE (A) NONE DETECTED   Barbiturates NONE DETECTED NONE DETECTED     Comment: (NOTE) DRUG SCREEN FOR MEDICAL PURPOSES ONLY.  IF CONFIRMATION IS NEEDED FOR ANY PURPOSE, NOTIFY LAB WITHIN 5 DAYS.  LOWEST DETECTABLE LIMITS FOR URINE DRUG SCREEN Drug Class                     Cutoff (ng/mL) Amphetamine and metabolites    1000 Barbiturate and metabolites    200 Benzodiazepine                 200 Tricyclics and metabolites  300 Opiates and metabolites        300 Cocaine and metabolites        300 THC                            50 Performed at [facility], [address] [place], [place] [address]   Comprehensive metabolic panel     Status: None   Collection Time: 11/24/20  7:52 PM  Result Value Ref Range   Sodium 140 135 - 145 mmol/L   Potassium 3.8 3.5 - 5.1 mmol/L   Chloride 100 98 - 111 mmol/L   CO2 29 22 - 32 mmol/L   Glucose, Bld 95 70 - 99 mg/dL    Comment: Glucose reference range applies only to samples taken after fasting for at least 8 hours.   BUN 13 6 - 20 mg/dL   Creatinine, Ser 6.041.01 0.61 - 1.24 mg/dL   Calcium 9.9 8.9 - 54.010.3 mg/dL   Total Protein 7.1 6.5 - 8.1 g/dL   Albumin 4.7 3.5 - 5.0 g/dL   AST 29 15 - 41 U/L   ALT 29 0 - 44 U/L   Alkaline Phosphatase 64 38 - 126 U/L   Total Bilirubin 0.5 0.3 - 1.2 mg/dL   GFR, Estimated >98>60 >11>60 mL/min    Comment: (NOTE) Calculated using the CKD-EPI Creatinine Equation (2021)    Anion gap 11 5 - 15    Comment: Performed at [facility], [address] [place], [place] [address]  Ethanol     Status: None   Collection Time: 11/24/20  7:52 PM  Result Value Ref Range   Alcohol, Ethyl (B) <10 <10 mg/dL    Comment: (NOTE) Lowest detectable limit for serum alcohol is 10 mg/dL.  For medical purposes only. Performed at [facility], [address] [place], [place] [address]   cbc     Status: Abnormal   Collection Time: 11/24/20  7:52 PM  Result Value Ref Range   WBC 7.4 4.0 - 10.5 K/uL   RBC 5.16 4.22 - 5.81 MIL/uL   Hemoglobin 16.4 13.0 - 17.0 g/dL   HCT 13.045.7  86.539.0 - 78.452.0 %   MCV 88.6 80.0 - 100.0 fL   MCH 31.8 26.0 - 34.0 pg   MCHC 35.9 30.0 - 36.0 g/dL   RDW 69.611.0 (L) 29.511.5 - 28.415.5 %   Platelets 193 150 - 400 K/uL   nRBC 0.0 0.0 - 0.2 %    Comment: Performed at [facility], [address] [place], [place] [address]  Acetaminophen level     Status: Abnormal   Collection Time: 11/24/20  7:52 PM  Result Value Ref Range   Acetaminophen (Tylenol), Serum <10 (L) 10 - 30 ug/mL    Comment: (NOTE) Therapeutic concentrations vary significantly. A range of 10-30 ug/mL  may be an effective concentration for many patients. However, some  are best treated at concentrations outside of this range. Acetaminophen concentrations >150 ug/mL at 4 hours after ingestion  and >50 ug/mL at 12 hours after ingestion are often associated with  toxic reactions.  Performed at [facility], [address] [place], [place] [address]   Salicylate level     Status: Abnormal   Collection Time: 11/24/20  7:52 PM  Result Value Ref Range   Salicylate Lvl <7.0 (L) 7.0 - 30.0 mg/dL    Comment: Performed at [facility], [address], [place], [place] [address]  SARS CORONAVIRUS 2 (TAT 6-24 HRS) Nasopharyngeal Nasopharyngeal Swab     Status: None   Collection Time: 11/24/20  8:05 PM   Specimen: Nasopharyngeal Swab  Result Value Ref Range   SARS Coronavirus 2 NEGATIVE NEGATIVE    Comment: (NOTE) SARS-CoV-2 target nucleic acids are NOT DETECTED.  The SARS-CoV-2 RNA is generally detectable in upper and lower respiratory specimens during the acute phase of infection. Negative results do not preclude SARS-CoV-2 infection, do not rule out co-infections with other pathogens, and should not be used as the sole basis for treatment or other patient management decisions. Negative results must be combined with clinical observations, patient history, and epidemiological information. The expected result is Negative.  Fact Sheet for  Patients: HairSlick.no  Fact Sheet for Healthcare Providers: quierodirigir.com  This test is not yet approved or cleared by the [place] FDA and  has been authorized for detection and/or diagnosis of SARS-CoV-2 by FDA under an Emergency Use Authorization (EUA). This EUA will remain  in effect (meaning this test can be used) for the duration of the COVID-19 declaration under Se ction 564(b)(1) of the Act, 21 U.S.C. section 360bbb-3(b)(1), unless the authorization is terminated or revoked sooner.  Performed at [facility], [address] [place], [place] [address]     Blood Alcohol level:  Lab Results  Component Value Date   ETH <10 11/24/2020   ETH 204 (H) 03/30/2020    Metabolic Disorder Labs:  No results found for: HGBA1C, MPG No results found for: PROLACTIN No results found for: CHOL, TRIG, HDL, CHOLHDL, VLDL, LDLCALC  Current Medications: Current Facility-Administered Medications  Medication Dose Route Frequency Provider Last Rate Last Admin  . acetaminophen (TYLENOL) tablet 650 mg  650 mg Oral Q6H PRN Antonieta Pert, MD      . alum & mag hydroxide-simeth (MAALOX/MYLANTA) 200-200-20 MG/5ML suspension 30 mL  30 mL Oral Q4H PRN Antonieta Pert, MD      . cloBAZam (ONFI) tablet 20 mg  20 mg Oral BID Antonieta Pert, MD      . divalproex (DEPAKOTE) DR tablet 500 mg  500 mg Oral Q12H Antonieta Pert, MD      . hydrOXYzine (ATARAX/VISTARIL) tablet 25 mg  25 mg Oral TID PRN Antonieta Pert, MD      . LORazepam (ATIVAN) tablet 2 mg  2 mg Oral Q6H PRN Claudie Revering, MD       Or  . LORazepam (ATIVAN) injection 2 mg  2 mg Intramuscular Q6H PRN Claudie Revering, MD      . magnesium hydroxide (MILK OF MAGNESIA) suspension 30 mL  30 mL Oral Daily PRN Antonieta Pert, MD      . traZODone (DESYREL) tablet 100 mg  100 mg Oral QHS PRN Antonieta Pert, MD       PTA Medications: Medications Prior to  Admission  Medication Sig Dispense Refill Last Dose  . cloBAZam (ONFI) 20 MG tablet Take by mouth 2 (two) times daily.     . divalproex (DEPAKOTE) 500 MG DR tablet Take 500 mg by mouth 2 (two) times daily.     . traZODone (DESYREL) 50 MG tablet Take 100 mg by mouth at bedtime as needed for sleep.       Musculoskeletal: Strength & Muscle Tone: within normal limits Gait & Station: normal Patient leans: N/A  Psychiatric Specialty Exam: Physical Exam Vitals and nursing note  reviewed.  Constitutional:      Appearance: Normal appearance. He is normal weight.  HENT:     Head: Normocephalic.  Pulmonary:     Effort: Pulmonary effort is normal.  Musculoskeletal:        General: Normal range of motion.     Cervical back: Normal range of motion.  Neurological:     General: No focal deficit present.     Mental Status: He is alert and oriented to person, place, and time.  Psychiatric:        Attention and Perception: Attention and perception normal.        Mood and Affect: Affect normal.        Speech: Speech normal.        Behavior: Behavior normal. Behavior is cooperative.        Thought Content: Thought content normal. Thought content is not paranoid or delusional. Thought content does not include homicidal or suicidal ideation. Thought content does not include suicidal plan.        Cognition and Memory: Cognition and memory normal.        Judgment: Judgment normal.     Review of Systems  Constitutional: Negative for chills, diaphoresis and fever.  HENT: Negative for congestion, ear pain, hearing loss, rhinorrhea, sinus pain, sneezing and sore throat.   Eyes: Negative.   Respiratory: Negative for cough, shortness of breath and wheezing.   Cardiovascular: Negative for chest pain.  Gastrointestinal: Negative for abdominal pain, constipation, diarrhea, nausea and vomiting.  Endocrine: Negative for polydipsia, polyphagia and polyuria.  Genitourinary: Negative for difficulty urinating.   Musculoskeletal: Negative for arthralgias, back pain, myalgias and neck stiffness.  Skin: Negative for rash.  Allergic/Immunologic: Negative.   Neurological: Positive for seizures. Negative for dizziness, light-headedness and headaches.  Psychiatric/Behavioral: Positive for decreased concentration and sleep disturbance. Negative for agitation, behavioral problems, hallucinations, self-injury and suicidal ideas. The patient is nervous/anxious.     Blood pressure 140/74, pulse 90, temperature 98.8 F (37.1 C), temperature source Oral, resp. rate 18, height 6\' 1"  (1.854 m), weight 80.7 kg, SpO2 99 %.Body mass index is 23.48 kg/m.  General Appearance: Casual and Good hygiene  Eye Contact:  Good  Speech:  Normal Rate  Volume:  Normal  Mood:  Anxious and Depressed  Affect:  Congruent  Thought Process:  Coherent; tangential at times but redirectable  Orientation:  Full (Time, Place, and Person)  Thought Content:  Logical and No referential thinking, delusions or hallucinations  Suicidal Thoughts:  Denies current SI but reported SI when evaluated in urgent care  Homicidal Thoughts:  No  Memory:  Immediate;   Good Recent;   Fair Remote;   Fair  Judgement:  Fair  Insight:  Fair  Psychomotor Activity:  Normal  Concentration:  Concentration: Fair  Recall:  Fair  Fund of Knowledge:  Good  Language:  Good  Akathisia:  Negative  Handed:  Right  AIMS (if indicated):     Assets:  Communication Skills Desire for Improvement Housing Intimacy Physical Health Resilience Social Support Talents/Skills Transportation  ADL's:  Intact  Cognition:  WNL  Sleep:       Treatment Plan Summary: Patient is a 20 year old male with a history of cannabis use disorder, seizure disorder and substance-induced mood disorder versus major depressive disorder who presents with symptoms of depression worsening over the last 2 to 3 months in the context of heavy cannabis use during that time.  The patient now  reports that since ceasing cannabis use  he is starting to feel better.  He attributes his depressive symptoms to his cannabis use.  He was expressing suicidal ideation with plan upon evaluation in urgent care.  Inpatient hospitalization at this time required for safety and stabilization of patient.  Plan:  Daily contact with patient to assess and evaluate symptoms and progress in treatment.  Continue every 15-minute observation for patient safety.  Seizure Disorder   - Seizure precautions  - clobazam 20 mg twice a day  - Depakote 500 mg twice a day  - lorazepam  PO or IM prn seizure activity  Defer initiation of antidepressant for now and observe patient to see if symptoms improve with patient now off cannabis  Encourage patient to participate in group therapy, CBT, recreation therapy, milieu therapy  Patient provided permission for team to contact patient's parents.  Will expand database.  We will need referral to outpatient therapy and psychiatry upon discharge. Patient could also benefit from referral to substance use disorder treatment program for treatment of cannabis use disorder.  Observation Level/Precautions:  15 minute checks Seizure  Laboratory:  CBC HbAIC Lipid panel valproic acid level TSH  Psychotherapy:    Medications:    Consultations:    Discharge Concerns:    Estimated LOS:  Other:     Physician Treatment Plan for Primary Diagnosis: Substance or medication-induced depressive disorder (HCC) Long Term Goal(s): Improvement in symptoms so as ready for discharge  Short Term Goals: Ability to identify changes in lifestyle to reduce recurrence of condition will improve, Ability to verbalize feelings will improve, Ability to disclose and discuss suicidal ideas, Ability to demonstrate self-control will improve, Ability to identify and develop effective coping behaviors will improve and Ability to identify triggers associated with substance abuse/mental health issues  will improve  Physician Treatment Plan for Secondary Diagnosis: Principal Problem:   Substance or medication-induced depressive disorder (HCC) Active Problems:   MDD (major depressive disorder), recurrent severe, without psychosis (HCC)   Moderate cannabis use disorder (HCC)  Long Term Goal(s): Improvement in symptoms so as ready for discharge  Short Term Goals: Ability to identify changes in lifestyle to reduce recurrence of condition will improve, Ability to verbalize feelings will improve, Ability to disclose and discuss suicidal ideas, Ability to identify and develop effective coping behaviors will improve, Ability to maintain clinical measurements within normal limits will improve and Ability to identify triggers associated with substance abuse/mental health issues will improve  I certify that inpatient services furnished can reasonably be expected to improve the patient's condition.    Claudie Revering, MD 3/2/20225:00 PM

## 2020-11-25 NOTE — Progress Notes (Signed)
Patient is 20 yr old male, first admission to [facility], voluntary.  Stated he talked to his parents who brought him to [facility].  Using THC a long time a lot.  Last smoked Sunday morning,  Started using THC in high school when 20 yrs old.  "Constant, I forgot what it feels like to be sober.  Everybody said it makes you feel good, ready to quit now."  Epilepsy since 20 yrs old, first seizure.  Last seizure 7 months ago when he drank one tall beer, vomited up his medicine, and then had seizure.  Rated anxiety 8, depression 6, hopeless 10+.  Denied SI during admission, contracts for safety.  Denied HI.  Denied A/V hallucinations.  Denied pain.  Denied allergies.  Stated he takes depakote 250 mg bid.  When 20 yrs old, broke bone in R hand.  Appendectomy age 72 yrs old.  Last well visit 4 months ago.  Long time since last dental visit.  Tries not to eat sugar because of epilepsy.  Lost wight 195 lbs to 178 lbs in the past 3 months.  Does have trouble concentrating.  Denied physical, sexual, verbal abuse.  Decreased sleep, anxiety, depression, sad, decreased concentration.  Does have parental support. Lives with mother in house in [address].  Has medicaid.  Hx of MRSA when 20 yrs old.  Has GED.  Stressors, money, getting started in life, society.  Fall risk information given and discussed with patient who stated he understands.  Patient oriented to 300 hall, given food/drink. Patient has been very pleasant, cooperative.

## 2020-11-25 NOTE — ED Notes (Signed)
Lunch Ordered @ 1000. °

## 2020-11-25 NOTE — ED Notes (Signed)
Safe transport called 

## 2020-11-25 NOTE — ED Notes (Signed)
Pt  Reports feeling anxious and nauseated. Prn given .

## 2020-11-25 NOTE — [facility] Suicide Risk Assessment (Signed)
[facility] Admission Suicide Risk Assessment   Nursing information obtained from:  Patient Demographic factors:  Male,Adolescent or young adult,Caucasian,Low socioeconomic status Current Mental Status:  NA Loss Factors:  Financial problems / change in socioeconomic status,Decrease in vocational status Historical Factors:  Impulsivity Risk Reduction Factors:  Sense of responsibility to family,Living with another person, especially a relative  Total Time spent with patient: 1.5 hours Principal Problem: Substance or medication-induced depressive disorder (HCC) Diagnosis:  Principal Problem:   Substance or medication-induced depressive disorder (HCC) Active Problems:   MDD (major depressive disorder), recurrent severe, without psychosis (HCC)   Moderate cannabis use disorder (HCC)  Subjective Data: See history and present illness section of history and physical for 11/25/2020.  Continued Clinical Symptoms:  Alcohol Use Disorder Identification Test Final Score (AUDIT): 2 The "Alcohol Use Disorders Identification Test", Guidelines for Use in Primary Care, Second Edition.  World Science writer [facility]). Score between 0-7:  no or low risk or alcohol related problems. Score between 8-15:  moderate risk of alcohol related problems. Score between 16-19:  high risk of alcohol related problems. Score 20 or above:  warrants further diagnostic evaluation for alcohol dependence and treatment.   CLINICAL FACTORS:   Depression:   Insomnia Alcohol/Substance Abuse/Dependencies Epilepsy Previous Psychiatric Diagnoses and Treatments Medical Diagnoses and Treatments/Surgeries   Musculoskeletal: Strength & Muscle Tone: within normal limits Gait & Station: normal Patient leans: N/A  Psychiatric Specialty Exam: Physical Exam  Review of Systems  Blood pressure 140/74, pulse 90, temperature 98.8 F (37.1 C), temperature source Oral, resp. rate 18, height 6\' 1"  (1.854 m), weight 80.7 kg, SpO2 99 %.Body  mass index is 23.48 kg/m.  General Appearance: Casual and Good hygiene  Eye Contact:  Good  Speech:  Normal Rate  Volume:  Normal  Mood:  Anxious and Depressed  Affect:  Congruent  Thought Process:  Coherent  Orientation:  Full (Time, Place, and Person)  Thought Content:  Logical  Suicidal Thoughts:  Denies current SI but reported SI when evaluated in urgent care  Homicidal Thoughts:  No  Memory:  Immediate;   Good Recent;   Fair Remote;   Fair  Judgement:  Fair  Insight:  Fair  Psychomotor Activity:  Normal  Concentration:  Concentration: Fair  Recall:  Fair  Fund of Knowledge:  Good  Language:  Good  Akathisia:  Negative  Handed:  Right  AIMS (if indicated):     Assets:  Communication Skills Desire for Improvement Housing Intimacy Physical Health Resilience Social Support Talents/Skills Transportation  ADL's:  Intact  Cognition:  WNL  Sleep:         COGNITIVE FEATURES THAT CONTRIBUTE TO RISK:  None    SUICIDE RISK:   Mild:  Suicidal ideation of limited frequency, intensity, duration, and specificity.  There are no identifiable plans, no associated intent, mild dysphoria and related symptoms, good self-control (both objective and subjective assessment), few other risk factors, and identifiable protective factors, including available and accessible social support.  PLAN OF CARE: Plan of care as documented in history and physical note dated 11/25/2020. Continue every 15 minute safety checks. See H&P note for today for further details.  I certify that inpatient services furnished can reasonably be expected to improve the patient's condition.   01/25/2021, MD 11/25/2020, 4:58 PM

## 2020-11-25 NOTE — Progress Notes (Signed)
Pt stated he was feeling better after talking to Clinical research associate. Writer discussed anxiety and coping during stressful situations and interacting with peers     11/25/20 2100  Psych Admission Type (Psych Patients Only)  Admission Status Voluntary  Psychosocial Assessment  Patient Complaints Anxiety  Eye Contact Fair  Facial Expression Anxious  Affect Anxious  Speech Logical/coherent  Interaction Cautious;Assertive  Motor Activity Slow  Appearance/Hygiene Other (Comment) (WNL)  Behavior Characteristics Appropriate to situation  Mood Anxious  Aggressive Behavior  Effect No apparent injury  Thought Process  Coherency WDL  Content WDL  Delusions None reported or observed  Perception WDL  Hallucination None reported or observed  Judgment Impaired  Confusion WDL  Danger to Self  Current suicidal ideation? Denies  Danger to Others  Danger to Others None reported or observed

## 2020-11-25 NOTE — ED Notes (Signed)
Pt talking on phone and reporting to caller he is a prisoner and can not leave.

## 2020-11-25 NOTE — ED Provider Notes (Addendum)
  Physical Exam  BP (!) 141/70 (BP Location: Right Arm)   Pulse (!) 59   Temp 98.5 F (36.9 C) (Oral)   Resp 18   SpO2 99%   Physical Exam  ED Course/Procedures     Procedures  MDM  Patient reportedly wanting to leave.  Psychiatry is at their morning meeting discussing about whether he needs to stay.  Inpatient treatment has been recommended.  At this point if he attempts to leave before further evaluation by behavioral health will involuntarily commit him but waiting for further psych input Patient has been accepted at behavioral health.  Will transfer.      Benjiman Core, MD 11/25/20 5809    Benjiman Core, MD 11/25/20 1227

## 2020-11-25 NOTE — Progress Notes (Signed)
Pt accepted to [facility] room 307-2  Meets inpatient per  Assunta Found, NP  Dr. Jola Babinski is the attending provider.    Call report to [phone]    Celine Ahr, RN @ [facility] ED notified.     Pt is Voluntary.    Pt may be transported by [employer]   Pt scheduled  to arrive at [facility] at 1300 PM  Signed:  Corky Crafts, MSW, LCSWA, LCASA 11/25/2020 11:02 AM

## 2020-11-25 NOTE — ED Notes (Signed)
TC from Pt's Mother reporting she wanted to speak to a Provider. Pt's Mother reported she had called [facility] and was told to call ED.

## 2020-11-25 NOTE — ED Notes (Signed)
Pt updated on plan of care with transfer to [facility] .

## 2020-11-25 NOTE — Progress Notes (Signed)
Pt came to nurses station and said "I think I may have had a seizure or something when I fell asleep - I think I bit my cheek."  Pt is alert and oriented during conversation. No breathing problems, loss of bladder or bowels no jerking movements in arms and legs are noted. Pt's blood pressure is 148/66, HR 74.  Pt given Depakote,Clobazam and Zofran as pt said he felt nauseated. RN observed inside of mouth using penlight. Pt has two pin point marks on inside of left cheek.  No swelling noted.   RN will continue to monitor and provide support as needed.

## 2020-11-25 NOTE — ED Notes (Signed)
ED Provider at bedside. 

## 2020-11-25 NOTE — ED Notes (Signed)
Report called to [facility] 

## 2020-11-25 NOTE — ED Notes (Signed)
TC to [facility]  To report Pt is anxious and has been talking with Family about going home. Pt reported he is  voluntary and he should be able to leave when he wants to.  This writer called [facility]  To report Pt wanted to go home.

## 2020-11-25 NOTE — Tx Team (Addendum)
Initial Treatment Plan 11/25/2020 3:24 PM Albert Gomez JOI:[id number]    PATIENT STRESSORS: Educational concerns Financial difficulties Occupational concerns Substance abuse  Health Issues (Seizures)   PATIENT STRENGTHS: Capable of independent living Communication skills Special hobby/interest Work skills   PATIENT IDENTIFIED PROBLEMS: Alterations in mood (Anxiety and Depression) "I don't have any job and I have no money right now".    Substance Abuse (Smokes marijuana) "I have been smoking for years & I smoke a lot".                 DISCHARGE CRITERIA:  Improved stabilization in mood, thinking, and/or behavior Verbal commitment to aftercare and medication compliance  PRELIMINARY DISCHARGE PLAN: Outpatient therapy Return to previous living arrangement  PATIENT/FAMILY INVOLVEMENT: This treatment plan has been presented to and reviewed with the patient, Albert Gomez. The patient have been given the opportunity to ask questions and make suggestions.  Sherryl Manges, RN 11/25/2020, 3:24 PM

## 2020-11-26 DIAGNOSIS — F1994 Other psychoactive substance use, unspecified with psychoactive substance-induced mood disorder: Secondary | ICD-10-CM

## 2020-11-26 LAB — CBC WITH DIFFERENTIAL/PLATELET
Abs Immature Granulocytes: 0.02 10*3/uL (ref 0.00–0.07)
Basophils Absolute: 0 10*3/uL (ref 0.0–0.1)
Basophils Relative: 0 %
Eosinophils Absolute: 0.1 10*3/uL (ref 0.0–0.5)
Eosinophils Relative: 1 %
HCT: 46.2 % (ref 39.0–52.0)
Hemoglobin: 16.4 g/dL (ref 13.0–17.0)
Immature Granulocytes: 0 %
Lymphocytes Relative: 45 %
Lymphs Abs: 3.3 10*3/uL (ref 0.7–4.0)
MCH: 32.2 pg (ref 26.0–34.0)
MCHC: 35.5 g/dL (ref 30.0–36.0)
MCV: 90.8 fL (ref 80.0–100.0)
Monocytes Absolute: 0.6 10*3/uL (ref 0.1–1.0)
Monocytes Relative: 9 %
Neutro Abs: 3.3 10*3/uL (ref 1.7–7.7)
Neutrophils Relative %: 45 %
Platelets: 183 10*3/uL (ref 150–400)
RBC: 5.09 MIL/uL (ref 4.22–5.81)
RDW: 11.3 % — ABNORMAL LOW (ref 11.5–15.5)
WBC: 7.3 10*3/uL (ref 4.0–10.5)
nRBC: 0 % (ref 0.0–0.2)

## 2020-11-26 LAB — LIPID PANEL
Cholesterol: 149 mg/dL (ref 0–200)
HDL: 40 mg/dL — ABNORMAL LOW (ref >40)
LDL Cholesterol: 98 mg/dL (ref 0–99)
Total CHOL/HDL Ratio: 3.7 RATIO
Triglycerides: 53 mg/dL (ref <150)
VLDL: 11 mg/dL (ref 0–40)

## 2020-11-26 LAB — TSH: TSH: 1.372 u[IU]/mL (ref 0.350–4.500)

## 2020-11-26 LAB — VALPROIC ACID LEVEL: Valproic Acid Lvl: 86 ug/mL (ref 50.0–100.0)

## 2020-11-26 LAB — HEMOGLOBIN A1C
Hgb A1c MFr Bld: 4.9 % (ref 4.8–5.6)
Mean Plasma Glucose: 93.93 mg/dL

## 2020-11-26 MED ORDER — TRAZODONE HCL 150 MG PO TABS
150.0000 mg | ORAL_TABLET | Freq: Every evening | ORAL | Status: DC | PRN
  Administered 2020-11-26 – 2020-11-27 (×2): 150 mg via ORAL
  Filled 2020-11-26 (×2): qty 1

## 2020-11-26 MED ORDER — ESCITALOPRAM OXALATE 5 MG PO TABS
5.0000 mg | ORAL_TABLET | Freq: Every day | ORAL | Status: DC
  Administered 2020-11-26 – 2020-11-28 (×3): 5 mg via ORAL
  Filled 2020-11-26 (×5): qty 1

## 2020-11-26 MED ORDER — ONDANSETRON 4 MG PO TBDP
4.0000 mg | ORAL_TABLET | Freq: Three times a day (TID) | ORAL | Status: DC | PRN
  Administered 2020-11-26 – 2020-11-28 (×5): 4 mg via ORAL
  Filled 2020-11-26 (×3): qty 1

## 2020-11-26 NOTE — Progress Notes (Signed)
Patient did not attend wrap up group. 

## 2020-11-26 NOTE — Progress Notes (Signed)
Nutrition Brief Note RD working remotely.  Patient identified on the Malnutrition Screening Tool (MST) Report  Wt Readings from Last 15 Encounters:  11/25/20 80.7 kg  06/03/18 80.8 kg (85 %, Z= 1.05)*  06/02/18 80.8 kg (85 %, Z= 1.05)*  04/15/17 84.9 kg (93 %, Z= 1.50)*  04/13/17 85.2 kg (94 %, Z= 1.52)*  12/23/16 78.3 kg (88 %, Z= 1.19)*  12/20/16 78.8 kg (89 %, Z= 1.22)*  11/26/16 79.2 kg (90 %, Z= 1.27)*  09/30/16 79.3 kg (91 %, Z= 1.31)*  09/14/16 79.8 kg (91 %, Z= 1.36)*  08/13/16 81.6 kg (93 %, Z= 1.49)*  03/10/16 79.4 kg (93 %, Z= 1.48)*  04/17/15 72.4 kg (91 %, Z= 1.36)*  02/13/15 72.2 kg (92 %, Z= 1.41)*  06/24/14 63.2 kg (86 %, Z= 1.07)*   * Growth percentiles are based on CDC (Boys, 2-20 Years) data.    Body mass index is 23.48 kg/m. Patient meets criteria for normal weight based on current BMI. Weight has been stable for the past 2 and a half years.   Patient was admitted for substance or medication-induced depressive disorder.   Current diet order is Regular and patient is eating as desired for meals and snacks. Labs and medications reviewed.   No nutrition interventions warranted at this time. If nutrition issues arise, please consult RD.       Albert Gammon, MS, RD, LDN, CNSC Inpatient Clinical Dietitian RD pager # available in [facility]  After hours/weekend pager # available in [facility]

## 2020-11-26 NOTE — Progress Notes (Signed)
[facility] MD Progress Note  11/26/2020 1:05 PM Albert Gomez  MRN:  [id number] Subjective: Patient seen and interviewed.  Chart reviewed.  Case was discussed in detail with members of the treatment team today during treatment team rounds.  Patient is a 21 year old male with cannabis use disorder, anxiety and depression as well as a seizure disorder who is admitted for symptoms of worsening depression and suicidal ideation.  On interview today, patient reports that he slept well.  He took as needed Ativan last night due to concerns about having a seizure.  He also took as needed trazodone.  The patient denies any anhedonia or depressed mood today.  He describes his mood as anxious. He describes anxiety as 3 out of 10 in intensity.  The patient denies suicidal ideation intent preparation or plan.  He denies passive wishes for death.  He denies assaultive ideation or homicidal ideation.  The patient denies auditory or visual hallucinations, paranoia or referential thinking.  The patient reports experiencing some nausea and difficulty sleeping in the context of recently stopping cannabis use.  Appetite is decreased secondary to nausea although he was able to eat after taking as needed Zofran.  The patient reports longstanding history of anxiety since the age of 73.  He reports feeling anxious in particular when he has to interact with others due to concerns that others will judge him negatively while he is talking to them.  Patient also reports having worries at night when he tries to sleep including worrying about finances, whether he should drive, what kind of job to get, etc.  He states his belief that his siblings may have problems with social anxiety disorder but are not treated.  Patient consents to trial of Lexapro for anxiety and depression.  He would like to increase trazodone dose to see if he will sleep better.  Principal Problem: Substance or medication-induced depressive disorder (HCC) Diagnosis:  Principal Problem:   Substance or medication-induced depressive disorder (HCC) Active Problems:   MDD (major depressive disorder), recurrent severe, without psychosis (HCC)   Moderate cannabis use disorder (HCC)  Total Time spent with patient: 30 minutes  Past Psychiatric History: Patient reports previously seeing a psychiatrist for anxiety.  States last visit with psychiatrist Albert Gomez) was about a year ago.  He was referred to the psychiatrist through the child epilepsy clinic.  He reports he was prescribed Klonopin in the past for anxiety.  He denies any other psychiatric medications or other psychiatric diagnoses.  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  The patient denies any history of psychotic symptoms or manic or hypomanic symptoms.  He reports past suicidal ideation and states that when he was on Keppra for his seizure disorder approximately 3 to 4 years ago he purchased a rope and climbed up with plans to hang himself but received a phone call and did not proceed with the attempted hanging.  He denies other near attempts or suicide attempts.  The patient reports longstanding history of anxiety since the age of 54.  He reports feeling anxious in particular when he has to interact with others due to concerns that others will judge him negatively while he is talking to them.  Patient also reports having worries at night when he tries to sleep including worrying about finances, whether he should drive,  what kind of job to get, etc.  He states his belief that his siblings may have problems with social anxiety disorder but are not treated.   Past Medical History:  Past Medical History:  Diagnosis Date  . Anxiety   . Concussion   .  Depression   . Epilepsy (HCC)   . Obsessive-compulsive disorder   . Sleep disorder   . Vomiting     Past Surgical History:  Procedure Laterality Date  . CIRCUMCISION  2001  . HAND SURGERY    . LAPAROSCOPIC APPENDECTOMY N/A 11/26/2016   Procedure: APPENDECTOMY LAPAROSCOPIC;  Surgeon: Kandice Hamsbinna O Adibe, MD;  Location: [facility] OR;  Service: General;  Laterality: N/A;   Family History:  Family History  Problem Relation Age of Onset  . Lung cancer Paternal Grandmother        Died at 2557   Family Psychiatric  History:  History of psychiatric problems in family members - patient denies other than undiagnosed possible social anxiety in his siblings History of suicide attempt in family members - patient denies History of seizure disorder in family members - yes; in mother, maternal uncle, maternal aunt and maternal cousin   Social History:  Social History   Substance and Sexual Activity  Alcohol Use Yes  . Alcohol/week: 0.0 standard drinks     Social History   Substance and Sexual Activity  Drug Use Yes  . Types: Marijuana    Social History   Socioeconomic History  . Marital status: Single    Spouse name: Not on file  . Number of children: Not on file  . Years of education: Not on file  . Highest education level: Not on file  Occupational History  . Not on file  Tobacco Use  . Smoking status: Current Every Day Smoker  . Smokeless tobacco: Never Used  Vaping Use  . Vaping Use: Never used  Substance and Sexual Activity  . Alcohol use: Yes    Alcohol/week: 0.0 standard drinks  . Drug use: Yes    Types: Marijuana  . Sexual activity: Yes    Partners: Female    Birth control/protection: Condom, Other-see comments    Comment: does not use protection every time  Other Topics Concern  . Not on file  Social History Narrative   Albert Riedeloah is currently working on his GED-does not attend high school.   He lives with his parents and 3 siblings.   Social Determinants of Health   Financial  Resource Strain: Not on file  Food Insecurity: Not on file  Transportation Needs: Not on file  Physical Activity: Not on file  Stress: Not on file  Social Connections: Not on file   Additional Social History:                         Sleep: Fair  Appetite:  Poor  Current Medications: Current Facility-Administered Medications  Medication Dose Route Frequency Provider Last Rate Last Admin  . acetaminophen (TYLENOL) tablet 650 mg  650 mg Oral Q6H PRN Antonieta Pertlary, Greg Lawson, MD      . alum & mag hydroxide-simeth (MAALOX/MYLANTA) 200-200-20 MG/5ML suspension 30 mL  30 mL Oral Q4H PRN Antonieta Pertlary, Greg Lawson, MD      . cloBAZam (ONFI) tablet 20 mg  20 mg Oral BID Aldean BakerSykes, Janet E, NP   20 mg at 11/26/20 [id number]  . divalproex (DEPAKOTE) DR tablet 500 mg  500 mg Oral Q12H Aldean BakerSykes, Janet E, NP   500  mg at 11/26/20 0811  . escitalopram (LEXAPRO) tablet 5 mg  5 mg Oral Daily Claudie Revering, MD   5 mg at 11/26/20 1232  . hydrOXYzine (ATARAX/VISTARIL) tablet 25 mg  25 mg Oral TID PRN Antonieta Pert, MD   25 mg at 11/26/20 0811  . LORazepam (ATIVAN) tablet 2 mg  2 mg Oral Q6H PRN Claudie Revering, MD   2 mg at 11/25/20 2150   Or  . LORazepam (ATIVAN) injection 2 mg  2 mg Intramuscular Q6H PRN Claudie Revering, MD      . magnesium hydroxide (MILK OF MAGNESIA) suspension 30 mL  30 mL Oral Daily PRN Antonieta Pert, MD      . ondansetron (ZOFRAN-ODT) disintegrating tablet 4 mg  4 mg Oral Q12H Aldean Baker, NP   4 mg at 11/26/20 8119  . traZODone (DESYREL) tablet 150 mg  150 mg Oral QHS PRN Claudie Revering, MD        Lab Results:  Results for orders placed or performed during the hospital encounter of 11/25/20 (from the past 48 hour(s))  Valproic acid level     Status: None   Collection Time: 11/25/20  5:59 PM  Result Value Ref Range   Valproic Acid Lvl 64 50.0 - 100.0 ug/mL    Comment: Performed at [facility], [address], [place], [place] [address]  Hemoglobin A1c      Status: None   Collection Time: 11/26/20  6:21 AM  Result Value Ref Range   Hgb A1c MFr Bld 4.9 4.8 - 5.6 %    Comment: (NOTE) Pre diabetes:          5.7%-6.4%  Diabetes:              >6.4%  Glycemic control for   <7.0% adults with diabetes    Mean Plasma Glucose 93.93 mg/dL    Comment: Performed at [facility], [address] [place], [place] [address]  Lipid panel     Status: Abnormal   Collection Time: 11/26/20  6:21 AM  Result Value Ref Range   Cholesterol 149 0 - 200 mg/dL   Triglycerides 53 <308 mg/dL   HDL 40 (L) >65 mg/dL   Total CHOL/HDL Ratio 3.7 RATIO   VLDL 11 0 - 40 mg/dL   LDL Cholesterol 98 0 - 99 mg/dL    Comment:        Total Cholesterol/HDL:CHD Risk Coronary Heart Disease Risk Table                     Men   Women  1/2 Average Risk   3.4   3.3  Average Risk       5.0   4.4  2 X Average Risk   9.6   7.1  3 X Average Risk  23.4   11.0        Use the calculated Patient Ratio above and the CHD Risk Table to determine the patient's CHD Risk.        ATP III CLASSIFICATION (LDL):  <100     mg/dL   Optimal  784-696  mg/dL   Near or Above                    Optimal  130-159  mg/dL   Borderline  295-284  mg/dL   High  >132     mg/dL   Very High Performed at [facility], [address] [place], [place] [address]   TSH     Status: None   Collection Time: 11/26/20  6:21 AM  Result Value Ref Range   TSH 1.372 0.350 - 4.500 uIU/mL    Comment: Performed by a 3rd Generation assay with a functional sensitivity of <=0.01 uIU/mL. Performed at [facility], [address] [place], [place] [address]   CBC with Differential/Platelet     Status: Abnormal   Collection Time: 11/26/20  6:21 AM  Result Value Ref Range   WBC 7.3 4.0 - 10.5 K/uL   RBC 5.09 4.22 - 5.81 MIL/uL   Hemoglobin 16.4 13.0 - 17.0 g/dL   HCT 46.9 62.9 - 52.8 %   MCV 90.8 80.0 - 100.0 fL   MCH 32.2 26.0 - 34.0 pg   MCHC 35.5 30.0 - 36.0 g/dL    RDW 41.3 (L) 24.4 - 15.5 %   Platelets 183 150 - 400 K/uL   nRBC 0.0 0.0 - 0.2 %   Neutrophils Relative % 45 %   Neutro Abs 3.3 1.7 - 7.7 K/uL   Lymphocytes Relative 45 %   Lymphs Abs 3.3 0.7 - 4.0 K/uL   Monocytes Relative 9 %   Monocytes Absolute 0.6 0.1 - 1.0 K/uL   Eosinophils Relative 1 %   Eosinophils Absolute 0.1 0.0 - 0.5 K/uL   Basophils Relative 0 %   Basophils Absolute 0.0 0.0 - 0.1 K/uL   Immature Granulocytes 0 %   Abs Immature Granulocytes 0.02 0.00 - 0.07 K/uL    Comment: Performed at [facility], [address], [place], [place] [address]  Valproic acid level     Status: None   Collection Time: 11/26/20  6:21 AM  Result Value Ref Range   Valproic Acid Lvl 86 50.0 - 100.0 ug/mL    Comment: Performed at [facility], [address] [place], [place] [address]    Blood Alcohol level:  Lab Results  Component Value Date   ETH <10 11/24/2020   ETH 204 (H) 03/30/2020    Metabolic Disorder Labs: Lab Results  Component Value Date   HGBA1C 4.9 11/26/2020   MPG 93.93 11/26/2020   No results found for: PROLACTIN Lab Results  Component Value Date   CHOL 149 11/26/2020   TRIG 53 11/26/2020   HDL 40 (L) 11/26/2020   CHOLHDL 3.7 11/26/2020   VLDL 11 11/26/2020   LDLCALC 98 11/26/2020    Physical Findings: AIMS:  , ,  ,  ,    CIWA:    COWS:     Musculoskeletal: Strength & Muscle Tone: within normal limits Gait & Station: normal Patient leans: N/A  Psychiatric Specialty Exam: Physical Exam Vitals and nursing note reviewed.  Constitutional:      Appearance: Normal appearance.  HENT:     Head: Normocephalic and atraumatic.  Pulmonary:     Effort: Pulmonary effort is normal.  Musculoskeletal:        General: Normal range of motion.     Cervical back: Normal range of motion.  Neurological:     General: No focal deficit present.     Mental Status: He is alert and oriented to person, place, and time.  Psychiatric:         Attention and Perception: Attention normal.        Mood and Affect: Mood is anxious.        Speech: Speech normal.  Behavior: Behavior is cooperative.        Thought Content: Thought content is not paranoid or delusional. Thought content does not include homicidal or suicidal ideation. Thought content does not include homicidal or suicidal plan.        Cognition and Memory: Cognition normal.        Judgment: Judgment normal.     Review of Systems  Constitutional: Negative for chills, diaphoresis and fever.  HENT: Negative for congestion, sneezing and sore throat.   Respiratory: Negative for cough and wheezing.   Cardiovascular: Negative for chest pain.  Gastrointestinal: Positive for nausea. Negative for abdominal pain, constipation, diarrhea and vomiting.  Genitourinary: Negative for difficulty urinating.  Musculoskeletal: Negative for arthralgias, back pain and myalgias.  Neurological: Negative for tremors and headaches.  Psychiatric/Behavioral: Negative for agitation, confusion, hallucinations, self-injury and suicidal ideas. The patient is nervous/anxious.     Blood pressure (!) 132/104, pulse (!) 103, temperature 98.6 F (37 C), temperature source Oral, resp. rate 18, height  (1.854 m), weight 80.7 kg, SpO2 100 %.Body mass index is 23.48 kg/m.  General Appearance: Casual and Neat  Eye Contact:  Good  Speech:  Clear and Coherent and Normal Rate  Volume:  Normal  Mood:  Anxious  Affect:  Congruent  Thought Process:  Coherent and Linear  Orientation:  Full (Time, Place, and Person)  Thought Content:  Logical and No paranoia, delusional content or referential thinking  Suicidal Thoughts:  No  Homicidal Thoughts:  No  Memory:  Immediate;   Good Recent;   Fair Remote;   Good  Judgement:  Fair  Insight:  Fair  Psychomotor Activity:  Normal  Concentration:  Concentration: Good  Recall:  Good  Fund of Knowledge:  Good  Language:  Good  Akathisia:  No   Handed:  Right  AIMS (if indicated):     Assets:  Communication Skills Desire for Improvement Financial Resources/Insurance Housing Intimacy Leisure Time Physical Health Social Support Talents/Skills Transportation  ADL's:  Intact  Cognition:  WNL  Sleep:  Number of Hours: 4     Treatment Plan Summary: Daily contact with patient to assess and evaluate symptoms and progress in treatment.  Continue every 15-minute observation level for patient safety  Anxiety/depression  -Start Lexapro 5 mg daily (initiated 11/26/2020).  Detailed discussion had with patient regarding indications for, target symptoms for, anticipated outcomes of, risks, benefits, side effects, alternatives to treatment with Lexapro.  Discussion included but not limited to increased risk of suicide attempt or self harming behavior in young adults with SSRIs.  Patient verbalized understanding and provided informed consent to trial of Lexapro to target symptoms of anxiety and depression.  Insomnia  -Increase trazodone to 150 mg at bedtime.  Detailed discussion had with patient regarding indications for, target symptoms for, anticipated outcomes of, risks, benefits, side effects, alternatives to treatment with trazodone.  Patient verbalized understanding and provided informed consent to trazodone.  Seizure Disorder   - Seizure precautions  - clobazam 20 mg twice a day  - Depakote 500 mg twice a day  - lorazepam  PO or IM prn seizure activity  Encourage patient to participate in CBT, recreation therapy, group therapy, milieu therapy while on the unit  I have reviewed the lab results including comprehensive metabolic panel that was within normal limits, lipid profile normal other than HDL of 40,  CBC within normal limits, repeat valproic acid level of 86.  Hemoglobin A1c of 4.9 glucose of 95 TSH of 1.372.  Urine drug screen was positive for THC and benzodiazepines (patient is prescribed benzodiazepines for seizure  disorder)  I certify that inpatient services furnished can reasonably be expected to improve the patient's condition.    Claudie Revering, MD 11/26/2020, 1:05 PM

## 2020-11-26 NOTE — Progress Notes (Signed)
Pt stated he was feeling suprisingly better, pt continues to stay to himself in his room much of the evening.      11/26/20 2200  Psych Admission Type (Psych Patients Only)  Admission Status Voluntary  Psychosocial Assessment  Patient Complaints Anxiety  Eye Contact Fair  Facial Expression Anxious  Affect Anxious  Speech Logical/coherent  Interaction Cautious;Assertive  Motor Activity Slow  Appearance/Hygiene Other (Comment) (WNL)  Behavior Characteristics Appropriate to situation  Mood Anxious  Aggressive Behavior  Effect No apparent injury  Thought Process  Coherency WDL  Content WDL  Delusions None reported or observed  Perception WDL  Hallucination None reported or observed  Judgment Impaired  Confusion WDL  Danger to Self  Current suicidal ideation? Denies  Danger to Others  Danger to Others None reported or observed

## 2020-11-26 NOTE — [facility] Counselor (Signed)
Adult Comprehensive Assessment  Patient ID: Albert Gomez, male   DOB: 08/13/2001, 20 y.o.   MRN: [id number]  Information Source: Information source: Patient  Current Stressors:  Patient states their primary concerns and needs for treatment are:: "Combination. I'm addicted to weed, which is causing me to get really depressed" Patient states their goals for this hospitilization and ongoing recovery are:: "To manage my anxiety, to be soberAnimator / Learning stressors: Denies stressor Employment / Job issues: Denies stressor Family Relationships: Denies Metallurgist / Lack of resources (include bankruptcy): "Kind of" Housing / Lack of housing: "Live with my mom, it's not bad, but I would like to move out" Physical health (include injuries & life threatening diseases): Epilepsy Social relationships: "Pushed everyone out of my life because of the weed" Substance abuse: Yes, due to effects of THC Bereavement / Loss: Denies stressor  Living/Environment/Situation:  Living Arrangements: Parent Living conditions (as described by patient or guardian): Lives with mother Who else lives in the home?: Mother, Mother's boyfriend, brother How long has patient lived in current situation?: "A couple of years" What is atmosphere in current home: Comfortable  Family History:  Marital status: Long term relationship Long term relationship, how long?: 3 years What types of issues is patient dealing with in the relationship?: Denies any issues Additional relationship information: n/a Are you sexually active?: Yes What is your sexual orientation?: heterosexual Has your sexual activity been affected by drugs, alcohol, medication, or emotional stress?: NA Does patient have children?: No  Childhood History:  By whom was/is the patient raised?: Mother/father and step-parent Additional childhood history information: "Really good. [place]" Description of  patient's relationship with caregiver when they were a child: "Always good" Patient's description of current relationship with people who raised him/her: "Very good" How were you disciplined when you got in trouble as a child/adolescent?: Long talks Does patient have siblings?: Yes Number of Siblings: 3 Description of patient's current relationship with siblings: states thay have a good relationship Did patient suffer any verbal/emotional/physical/sexual abuse as a child?: No Did patient suffer from severe childhood neglect?: No Has patient ever been sexually abused/assaulted/raped as an adolescent or adult?: No Was the patient ever a victim of a crime or a disaster?: No Witnessed domestic violence?: No Has patient been affected by domestic violence as an adult?: No  Education:  Highest grade of school patient has completed: GED Currently a Consulting civil engineer?: No Learning disability?: No  Employment/Work Situation:   Employment situation: Unemployed Patient's job has been impacted by current illness: Yes Describe how patient's job has been impacted: Unable to work due to epilepsy What is the longest time patient has a held a job?: 2 months 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 Recently it has been everyday all day. If attempted suicide, did drugs/alcohol play a role in this?: No Alcohol/Substance Abuse Treatment Hx: Denies past history Has alcohol/substance abuse ever caused legal problems?: No  Social Support System:   Patient's Community Support System: Good Describe Community Support System: Mother, dad, brother Type of faith/religion: [employer]ri[employer] Knuckles How does  patient's faith help to cope with current illness?: "  When I have suicidal thoughts, thinking about god keeps me from doing anything"  Leisure/Recreation:   Do You Have Hobbies?: Yes Leisure and Hobbies: Working out  Strengths/Needs:   What is the patient's perception of their strengths?: Physically healthy Patient states they can use these personal strengths during their treatment to contribute to their recovery: Unsure Patient states these barriers may affect/interfere with their treatment: None Patient states these barriers may affect their return to the community: None Other important information patient would like considered in planning for their treatment: None  Discharge Plan:   Currently receiving community mental health services: No Patient states concerns and preferences for aftercare planning are: Is interested in being set up with therapy and medication management Patient states they will know when they are safe and ready for discharge when: Yes Does patient have access to transportation?: Yes Does patient have financial barriers related to discharge medications?: No Patient description of barriers related to discharge medications: none Will patient be returning to same living situation after discharge?: Yes  Summary/Recommendations:   Summary and Recommendations (to be completed by the evaluator): Pt reports that they are currently sexually active.  Pt was raised by his parents and reports that the relationship was very good as they grew up. Pt reports that their current relationship with their caretakers is still very good. Pt reports were not verbally/emotionally/physically/sexually abused as a child.  Pt's highest level of education is 11th grade. Pt is currently unemployed. Pt reports daily THC use. Pt describes their support system as good and states mother, father, and brother is apart of it. Pt currently sees no outpatient providers. Pt will live at home with mother when  they discharge. While here, Albert Gomez can benefit from crisis stabilization, medication management, therapeutic milieu, and referrals for services.  Albert Gomez. 11/26/2020

## 2020-11-27 NOTE — Progress Notes (Signed)
Recreation Therapy Notes  Date: 11/27/2020 Time: 9:30a Location: [address] Dayroom  Group Topic: Stress Management  Goal Area(s) Addresses:  Patient will identify positive stress management techniques. Patient will identify benefits of using stress management post d/c.  Behavioral Response: Attentive  Intervention: Worksheet, Group brain storming  Activity :  Mind Map.  Patient was provided a blank template of a diagram with 32 blank boxes in a tiered system, branching from the center (similar to a bubble chart). LRT directed patients to label the middle of the diagram "Stress" and consider 8 different sources of stress in their day to day life. Pt were directed to record their stressors in the 2nd tier boxes closest to the center. Patients were to then come up with 3 effective techniques to address each identified area in the remaining boxes stemming from a particular stressor. Pts were encouraged to share ideas with one another and ask for suggestions of peers and Clinical research associate when stuck on a certain category of stress.  Education:  Stress Management, Discharge Planning.   Education Outcome: Acknowledges Education  Clinical Observations/Feedback: Pt required encouragement to remain in group and begin worksheet. Pt was quiet throughout group session but, observed to actively write as peers offered suggestions for particular stressors. Pt was appropriate and on-task throughout activity.     Nicholos Johns Jaser Fullen, LRT/CTRS Benito Mccreedy Rhylynn Perdomo 11/27/2020, 1:09 PM

## 2020-11-27 NOTE — [facility] Group Notes (Signed)
Type of Therapy and Topic:  Group Therapy:  Positive Affirmations   Participation Level:  Active  Description of Group: This group addressed positive affirmation toward self and others. Patients went around the room and identified two positive things about themselves and two positive things about a peer in the room. Patients reflected on how it felt to share something positive with others, to identify positive things about themselves, and to hear positive things from others. Patients were encouraged to have a daily reflection of positive characteristics or circumstances. Therapeutic Goals 1. Patient will verbalize two of their positive qualities 2. Patient will demonstrate empathy for others by stating two positive qualities about a peer in the group 3. Patient will verbalize their feelings when voicing positive self affirmations and when voicing positive affirmations of others 4. Patients will discuss the potential positive impact on their wellness/recovery of focusing on positive traits of self and others. Summary of Patient Progress:  The Pt attended group and accepted the worksheets that were provided.   Therapeutic Modalities Cognitive Behavioral Therapy Motivational Interviewing 

## 2020-11-27 NOTE — Progress Notes (Signed)
[facility] MD Progress Note  11/27/2020 4:09 PM Franz Svec  MRN:  [id number] Subjective: Patient seen and interviewed.  Chart reviewed.  Case was discussed in detail with members of the treatment team today during treatment team rounds.  Patient is a 20 year old male with cannabis use disorder, anxiety and depression as well as seizure disorder who was admitted for worsening symptoms of depression and suicidal ideation in the context of ongoing cannabis use.  On interview today, the patient reports that he is feeling much better.  He reports that his anxiety is better controlled than it was yesterday.  He denies depressed mood.  He denies wish for death or suicidal ideation intent preparation or plan.  He denies anxiety when speaking with peers or staff on the unit.  He denies racing thoughts or worries at night when trying to fall asleep.  He reports that he slept well yesterday and nap some during the day yesterday.  The patient denies thoughts of harming or killing others, auditory or visual hallucinations or paranoia.  The patient denies problems with his medications or medication side effects.  He states that he is motivated to continue to refrain from use of cannabis after discharge.  Patient states he believes that his heavy cannabis use was responsible for both his depression and was significantly contributing to his anxiety.  He denies any physical problems.   Principal Problem: Substance or medication-induced depressive disorder (HCC) Diagnosis: Principal Problem:   Substance or medication-induced depressive disorder (HCC) Active Problems:   MDD (major depressive disorder), recurrent severe, without psychosis (HCC)   Moderate cannabis use disorder (HCC)  Total Time spent with patient: 30 minutes  Past Psychiatric History: See H&P  Past Medical History:  Past Medical History:  Diagnosis Date  . Anxiety   . Concussion   . Depression   . Epilepsy (HCC)   . Obsessive-compulsive  disorder   . Sleep disorder   . Vomiting     Past Surgical History:  Procedure Laterality Date  . CIRCUMCISION  2001  . HAND SURGERY    . LAPAROSCOPIC APPENDECTOMY N/A 11/26/2016   Procedure: APPENDECTOMY LAPAROSCOPIC;  Surgeon: Kandice Hams, MD;  Location: [facility] OR;  Service: General;  Laterality: N/A;   Family History:  Family History  Problem Relation Age of Onset  . Lung cancer Paternal Grandmother        Died at 55   Family Psychiatric  History: See H&P Social History:  Social History   Substance and Sexual Activity  Alcohol Use Yes  . Alcohol/week: 0.0 standard drinks     Social History   Substance and Sexual Activity  Drug Use Yes  . Types: Marijuana    Social History   Socioeconomic History  . Marital status: Single    Spouse name: Not on file  . Number of children: Not on file  . Years of education: Not on file  . Highest education level: Not on file  Occupational History  . Not on file  Tobacco Use  . Smoking status: Current Every Day Smoker  . Smokeless tobacco: Never Used  Vaping Use  . Vaping Use: Never used  Substance and Sexual Activity  . Alcohol use: Yes    Alcohol/week: 0.0 standard drinks  . Drug use: Yes    Types: Marijuana  . Sexual activity: Yes    Partners: Female    Birth control/protection: Condom, Other-see comments    Comment: does not use protection every time  Other Topics Concern  .  Not on file  Social History Narrative   [place] is currently working on his GED-does not attend high school.   He lives with his parents and 3 siblings.   Social Determinants of Health   Financial Resource Strain: Not on file  Food Insecurity: Not on file  Transportation Needs: Not on file  Physical Activity: Not on file  Stress: Not on file  Social Connections: Not on file   Additional Social History:                         Sleep: Good  Appetite:  Good  Current Medications: Current Facility-Administered Medications   Medication Dose Route Frequency Provider Last Rate Last Admin  . acetaminophen (TYLENOL) tablet 650 mg  650 mg Oral Q6H PRN Antonieta Pert, MD      . alum & mag hydroxide-simeth (MAALOX/MYLANTA) 200-200-20 MG/5ML suspension 30 mL  30 mL Oral Q4H PRN Antonieta Pert, MD      . cloBAZam (ONFI) tablet 20 mg  20 mg Oral BID Aldean Baker, NP   20 mg at 11/27/20 0829  . divalproex (DEPAKOTE) DR tablet 500 mg  500 mg Oral Q12H Aldean Baker, NP   500 mg at 11/27/20 0830  . escitalopram (LEXAPRO) tablet 5 mg  5 mg Oral Daily Claudie Revering, MD   5 mg at 11/27/20 0830  . hydrOXYzine (ATARAX/VISTARIL) tablet 25 mg  25 mg Oral TID PRN Antonieta Pert, MD   25 mg at 11/26/20 2129  . LORazepam (ATIVAN) tablet 2 mg  2 mg Oral Q6H PRN Claudie Revering, MD   2 mg at 11/25/20 2150   Or  . LORazepam (ATIVAN) injection 2 mg  2 mg Intramuscular Q6H PRN Claudie Revering, MD      . magnesium hydroxide (MILK OF MAGNESIA) suspension 30 mL  30 mL Oral Daily PRN Antonieta Pert, MD      . ondansetron (ZOFRAN-ODT) disintegrating tablet 4 mg  4 mg Oral Q8H PRN Armandina Stammer I, NP   4 mg at 11/27/20 1003  . traZODone (DESYREL) tablet 150 mg  150 mg Oral QHS PRN Claudie Revering, MD   150 mg at 11/26/20 2129    Lab Results:  Results for orders placed or performed during the hospital encounter of 11/25/20 (from the past 48 hour(s))  Valproic acid level     Status: None   Collection Time: 11/25/20  5:59 PM  Result Value Ref Range   Valproic Acid Lvl 64 50.0 - 100.0 ug/mL    Comment: Performed at [facility], [address] [place], [place] [address]  Hemoglobin A1c     Status: None   Collection Time: 11/26/20  6:21 AM  Result Value Ref Range   Hgb A1c MFr Bld 4.9 4.8 - 5.6 %    Comment: (NOTE) Pre diabetes:          5.7%-6.4%  Diabetes:              >6.4%  Glycemic control for   <7.0% adults with diabetes    Mean Plasma Glucose 93.93 mg/dL    Comment: Performed at [facility], [address], [place], [place] [address]  Lipid panel     Status: Abnormal   Collection Time: 11/26/20  6:21 AM  Result Value Ref Range   Cholesterol 149 0 - 200 mg/dL   Triglycerides 53 <751 mg/dL  HDL 40 (L) >40 mg/dL   Total CHOL/HDL Ratio 3.7 RATIO   VLDL 11 0 - 40 mg/dL   LDL Cholesterol 98 0 - 99 mg/dL    Comment:        Total Cholesterol/HDL:CHD Risk Coronary Heart Disease Risk Table                     Men   Women  1/2 Average Risk   3.4   3.3  Average Risk       5.0   4.4  2 X Average Risk   9.6   7.1  3 X Average Risk  23.4   11.0        Use the calculated Patient Ratio above and the CHD Risk Table to determine the patient's CHD Risk.        ATP III CLASSIFICATION (LDL):  <100     mg/dL   Optimal  196-222  mg/dL   Near or Above                    Optimal  130-159  mg/dL   Borderline  979-892  mg/dL   High  >119     mg/dL   Very High Performed at [facility], [address], [place], [place] [address]   TSH     Status: None   Collection Time: 11/26/20  6:21 AM  Result Value Ref Range   TSH 1.372 0.350 - 4.500 uIU/mL    Comment: Performed by a 3rd Generation assay with a functional sensitivity of <=0.01 uIU/mL. Performed at [facility], [address] [place], [place] [address]   CBC with Differential/Platelet     Status: Abnormal   Collection Time: 11/26/20  6:21 AM  Result Value Ref Range   WBC 7.3 4.0 - 10.5 K/uL   RBC 5.09 4.22 - 5.81 MIL/uL   Hemoglobin 16.4 13.0 - 17.0 g/dL   HCT 18.5 63.1 - 49.7 %   MCV 90.8 80.0 - 100.0 fL   MCH 32.2 26.0 - 34.0 pg   MCHC 35.5 30.0 - 36.0 g/dL   RDW 02.6 (L) 37.8 - 58.8 %   Platelets 183 150 - 400 K/uL   nRBC 0.0 0.0 - 0.2 %   Neutrophils Relative % 45 %   Neutro Abs 3.3 1.7 - 7.7 K/uL   Lymphocytes Relative 45 %   Lymphs Abs 3.3 0.7 - 4.0 K/uL   Monocytes Relative 9 %   Monocytes Absolute 0.6 0.1 - 1.0 K/uL   Eosinophils Relative 1 %   Eosinophils Absolute  0.1 0.0 - 0.5 K/uL   Basophils Relative 0 %   Basophils Absolute 0.0 0.0 - 0.1 K/uL   Immature Granulocytes 0 %   Abs Immature Granulocytes 0.02 0.00 - 0.07 K/uL    Comment: Performed at [facility], [address], [place], [place] [address]  Valproic acid level     Status: None   Collection Time: 11/26/20  6:21 AM  Result Value Ref Range   Valproic Acid Lvl 86 50.0 - 100.0 ug/mL    Comment: Performed at [facility], [address], [place], [place] [address]    Blood Alcohol level:  Lab Results  Component Value Date   ETH <10 11/24/2020   ETH 204 (H) 03/30/2020    Metabolic Disorder Labs: Lab Results  Component Value Date   HGBA1C 4.9 11/26/2020   MPG 93.93 11/26/2020  No results found for: PROLACTIN Lab Results  Component Value Date   CHOL 149 11/26/2020   TRIG 53 11/26/2020   HDL 40 (L) 11/26/2020   CHOLHDL 3.7 11/26/2020   VLDL 11 11/26/2020   LDLCALC 98 11/26/2020    Physical Findings: AIMS:  , ,  ,  ,    CIWA:    COWS:     Musculoskeletal: Strength & Muscle Tone: within normal limits Gait & Station: normal Patient leans: N/A  Psychiatric Specialty Exam: Physical Exam Vitals and nursing note reviewed.  Constitutional:      Appearance: Normal appearance. He is normal weight.  HENT:     Head: Normocephalic and atraumatic.  Pulmonary:     Effort: Pulmonary effort is normal.  Musculoskeletal:        General: Normal range of motion.     Cervical back: Normal range of motion.  Neurological:     General: No focal deficit present.     Mental Status: He is alert and oriented to person, place, and time.  Psychiatric:        Attention and Perception: He does not perceive auditory or visual hallucinations.        Mood and Affect: Affect normal. Mood is anxious. Mood is not depressed.        Speech: Speech normal.        Behavior: Behavior normal. Behavior is cooperative.        Thought Content: Thought content  normal.        Cognition and Memory: Cognition normal.        Judgment: Judgment normal.     Review of Systems  Constitutional: Negative for chills, diaphoresis and fever.  HENT: Negative for sore throat.   Respiratory: Negative for cough.   Cardiovascular: Negative for chest pain.  Gastrointestinal: Negative for constipation, diarrhea, nausea and vomiting.  Genitourinary: Negative for difficulty urinating.  Musculoskeletal: Negative for arthralgias, back pain and neck pain.  Neurological: Negative for seizures, light-headedness and headaches.  Psychiatric/Behavioral: Negative for agitation, behavioral problems, dysphoric mood, hallucinations, self-injury and suicidal ideas. The patient is nervous/anxious. The patient is not hyperactive.     Blood pressure 117/71, pulse 77, temperature 98.1 F (36.7 C), temperature source Oral, resp. rate 18, height 6\' 1"  (1.854 m), weight 80.7 kg, SpO2 97 %.Body mass index is 23.48 kg/m.  General Appearance: Casual and Well Groomed  Eye Contact:  Good  Speech:  Clear and Coherent and Normal Rate  Volume:  Normal  Mood:  Anxious  Affect:  Appears euthymic with full range  Thought Process:  Coherent, Goal Directed and Linear  Orientation:  Full (Time, Place, and Person)  Thought Content:  Logical and No paranoid ideation, delusions or referential thinking  Suicidal Thoughts:  No  Homicidal Thoughts:  No  Memory:  Immediate;   Good Recent;   Good Remote;   Good  Judgement:  Good  Insight:  Good  Psychomotor Activity:  Normal  Concentration:  Concentration: Good  Recall:  Good  Fund of Knowledge:  Good  Language:  Good  Akathisia:  No  Handed:  Right  AIMS (if indicated):     Assets:  Communication Skills Desire for Improvement Housing Intimacy Leisure Time Resilience Social Support Talents/Skills Transportation  ADL's:  Intact  Cognition:  WNL  Sleep:  Number of Hours: 6.75     Treatment Plan Summary: Daily contact with  patient to assess and evaluate symptoms and progress in treatment  Continue every 15-minute observation level  for patient safety.  Psychiatric medication management Anxiety/depression  -Continue Lexapro 5 mg daily for anxiety and depression. Insomnia  -Continue trazodone 150 mg at bedtime  Seizure disorder  -Seizure precaution  -Continue clobazam 20 mg twice a day  -Continue Depakote 500 mg twice a day  -Give as needed lorazepam 2 mg p.o. or IM as needed seizure activity  Courage patient to participate in CBT, group therapy, recreation therapy, milieu therapy while on the unit  I certify that inpatient services furnished can reasonably be expected to improve the patient's condition   Claudie Revering, MD 11/27/2020, 4:09 PM

## 2020-11-27 NOTE — Progress Notes (Addendum)
Pt stated he was feeling good. Pt talked about THC usage and quitting. Pt given PRN Trazodone, Vistaril , and Zofran per [facility]    11/27/20 2300  Psych Admission Type (Psych Patients Only)  Admission Status Voluntary  Psychosocial Assessment  Patient Complaints None  Eye Contact Fair  Facial Expression Anxious  Affect Anxious  Speech Logical/coherent  Interaction Cautious;Assertive  Motor Activity Slow  Appearance/Hygiene Other (Comment) (WNL)  Behavior Characteristics Cooperative  Mood Anxious  Aggressive Behavior  Effect No apparent injury  Thought Process  Coherency WDL  Content WDL  Delusions None reported or observed  Perception WDL  Hallucination None reported or observed  Judgment Impaired  Confusion WDL  Danger to Self  Current suicidal ideation? Denies  Danger to Others  Danger to Others None reported or observed

## 2020-11-27 NOTE — Progress Notes (Signed)
[facility] NOVEL CORONAVIRUS (COVID-19) DAILY CHECK-OFF SYMPTOMS - answer yes or no to each - every day NO YES  Have you had a fever in the past 24 hours?  . Fever (Temp > 37.80C / 100F) X   Have you had any of these symptoms in the past 24 hours? . New Cough .  Sore Throat  .  Shortness of Breath .  Difficulty Breathing .  Unexplained Body Aches   X   Have you had any one of these symptoms in the past 24 hours not related to allergies?   . Runny Nose .  Nasal Congestion .  Sneezing   X   If you have had runny nose, nasal congestion, sneezing in the past 24 hours, has it worsened?  X   EXPOSURES - check yes or no X   Have you traveled outside the state in the past 14 days?  X   Have you been in contact with someone with a confirmed diagnosis of COVID-19 or PUI in the past 14 days without wearing appropriate PPE?  X   Have you been living in the same home as a person with confirmed diagnosis of COVID-19 or a PUI (household contact)?    X   Have you been diagnosed with COVID-19?    X              What to do next: Answered NO to all: Answered YES to anything:   Proceed with unit schedule Follow the [facility] Inpatient Flowsheet.   

## 2020-11-27 NOTE — Progress Notes (Addendum)
D. Pt presents with an anxious affect and mood- pleasant upon approach- pt observed in the milieu interacting appropriately with peers and staff. Per pt's self inventory, pt rated his depression, hopelessness and anxiety a 0/0/1, respectively.  When asked about his goal  pt reports , "since I feel really good it is my goal to stay this way " and "continue to stay away from marijuana". Pt currently denies SI/HI and AVH  A. Labs and vitals monitored. Pt administered scheduled meds and given Zofran prn for nausea  Pt supported emotionally and encouraged to express concerns and ask questions.   R. Pt remains safe with 15 minute checks. Will continue POC.

## 2020-11-27 NOTE — Tx Team (Signed)
Interdisciplinary Treatment and Diagnostic Plan Update  11/27/2020 Time of Session: 9:30am  Albert Gomez MRN: [id number]  Principal Diagnosis: Substance or medication-induced depressive disorder (HCC)  Secondary Diagnoses: Principal Problem:   Substance or medication-induced depressive disorder (HCC) Active Problems:   MDD (major depressive disorder), recurrent severe, without psychosis (HCC)   Moderate cannabis use disorder (HCC)   Current Medications:  Current Facility-Administered Medications  Medication Dose Route Frequency Provider Last Rate Last Admin  . acetaminophen (TYLENOL) tablet 650 mg  650 mg Oral Q6H PRN Antonieta Pert, MD      . alum & mag hydroxide-simeth (MAALOX/MYLANTA) 200-200-20 MG/5ML suspension 30 mL  30 mL Oral Q4H PRN Antonieta Pert, MD      . cloBAZam (ONFI) tablet 20 mg  20 mg Oral BID Aldean Baker, NP   20 mg at 11/27/20 0829  . divalproex (DEPAKOTE) DR tablet 500 mg  500 mg Oral Q12H Aldean Baker, NP   500 mg at 11/27/20 0830  . escitalopram (LEXAPRO) tablet 5 mg  5 mg Oral Daily Claudie Revering, MD   5 mg at 11/27/20 0830  . hydrOXYzine (ATARAX/VISTARIL) tablet 25 mg  25 mg Oral TID PRN Antonieta Pert, MD   25 mg at 11/26/20 2129  . LORazepam (ATIVAN) tablet 2 mg  2 mg Oral Q6H PRN Claudie Revering, MD   2 mg at 11/25/20 2150   Or  . LORazepam (ATIVAN) injection 2 mg  2 mg Intramuscular Q6H PRN Claudie Revering, MD      . magnesium hydroxide (MILK OF MAGNESIA) suspension 30 mL  30 mL Oral Daily PRN Antonieta Pert, MD      . ondansetron (ZOFRAN-ODT) disintegrating tablet 4 mg  4 mg Oral Q8H PRN Armandina Stammer I, NP   4 mg at 11/27/20 1003  . traZODone (DESYREL) tablet 150 mg  150 mg Oral QHS PRN Claudie Revering, MD   150 mg at 11/26/20 2129   PTA Medications: Medications Prior to Admission  Medication Sig Dispense Refill Last Dose  . cloBAZam (ONFI) 20 MG tablet Take by mouth 2 (two) times daily.     . divalproex (DEPAKOTE) 500 MG DR  tablet Take 500 mg by mouth 2 (two) times daily.     . traZODone (DESYREL) 50 MG tablet Take 100 mg by mouth at bedtime as needed for sleep.       Patient Stressors: [employer] fast stream[employer] difficulties Occupational concerns Substance abuse  Patient Strengths: Capable of independent living Health visitor hobby/interest Work skills  Treatment Modalities: Medication Management, Group therapy, Case management,  1 to 1 session with clinician, Psychoeducation, Recreational therapy.   Physician Treatment Plan for Primary Diagnosis: Substance or medication-induced depressive disorder (HCC) Long Term Goal(s): Improvement in symptoms so as ready for discharge Improvement in symptoms so as ready for discharge   Short Term Goals: Ability to identify changes in lifestyle to reduce recurrence of condition will improve Ability to verbalize feelings will improve Ability to disclose and discuss suicidal ideas Ability to demonstrate self-control will improve Ability to identify and develop effective coping behaviors will improve Ability to identify triggers associated with substance abuse/mental health issues will improve Ability to identify changes in lifestyle to reduce recurrence of condition will improve Ability to verbalize feelings will improve Ability to disclose and discuss suicidal ideas Ability to identify and develop effective coping behaviors will improve Ability to maintain clinical measurements within normal limits will improve Ability to identify  triggers associated with substance abuse/mental health issues will improve  Medication Management: Evaluate patient's response, side effects, and tolerance of medication regimen.  Therapeutic Interventions: 1 to 1 sessions, Unit Group sessions and Medication administration.  Evaluation of Outcomes: Progressing  Physician Treatment Plan for Secondary Diagnosis: Principal Problem:   Substance or medication-induced  depressive disorder (HCC) Active Problems:   MDD (major depressive disorder), recurrent severe, without psychosis (HCC)   Moderate cannabis use disorder (HCC)  Long Term Goal(s): Improvement in symptoms so as ready for discharge Improvement in symptoms so as ready for discharge   Short Term Goals: Ability to identify changes in lifestyle to reduce recurrence of condition will improve Ability to verbalize feelings will improve Ability to disclose and discuss suicidal ideas Ability to demonstrate self-control will improve Ability to identify and develop effective coping behaviors will improve Ability to identify triggers associated with substance abuse/mental health issues will improve Ability to identify changes in lifestyle to reduce recurrence of condition will improve Ability to verbalize feelings will improve Ability to disclose and discuss suicidal ideas Ability to identify and develop effective coping behaviors will improve Ability to maintain clinical measurements within normal limits will improve Ability to identify triggers associated with substance abuse/mental health issues will improve     Medication Management: Evaluate patient's response, side effects, and tolerance of medication regimen.  Therapeutic Interventions: 1 to 1 sessions, Unit Group sessions and Medication administration.  Evaluation of Outcomes: Progressing   RN Treatment Plan for Primary Diagnosis: Substance or medication-induced depressive disorder (HCC) Long Term Goal(s): Knowledge of disease and therapeutic regimen to maintain health will improve  Short Term Goals: Ability to remain free from injury will improve, Ability to participate in decision making will improve, Ability to verbalize feelings will improve, Ability to disclose and discuss suicidal ideas and Ability to identify and develop effective coping behaviors will improve  Medication Management: RN will administer medications as ordered by  provider, will assess and evaluate patient's response and provide education to patient for prescribed medication. RN will report any adverse and/or side effects to prescribing provider.  Therapeutic Interventions: 1 on 1 counseling sessions, Psychoeducation, Medication administration, Evaluate responses to treatment, Monitor vital signs and CBGs as ordered, Perform/monitor CIWA, COWS, AIMS and Fall Risk screenings as ordered, Perform wound care treatments as ordered.  Evaluation of Outcomes: Progressing   LCSW Treatment Plan for Primary Diagnosis: Substance or medication-induced depressive disorder (HCC) Long Term Goal(s): Safe transition to appropriate next level of care at discharge, Engage patient in therapeutic group addressing interpersonal concerns.  Short Term Goals: Engage patient in aftercare planning with referrals and resources, Increase social support, Increase emotional regulation, Facilitate acceptance of mental health diagnosis and concerns, Identify triggers associated with mental health/substance abuse issues and Increase skills for wellness and recovery  Therapeutic Interventions: Assess for all discharge needs, 1 to 1 time with Social worker, Explore available resources and support systems, Assess for adequacy in community support network, Educate family and significant other(s) on suicide prevention, Complete Psychosocial Assessment, Interpersonal group therapy.  Evaluation of Outcomes: Progressing   Progress in Treatment: Attending groups: Yes. Participating in groups: Yes. Taking medication as prescribed: Yes. Toleration medication: Yes. Family/Significant other contact made: Yes, individual(s) contacted:  Mother  Patient understands diagnosis: Yes. Discussing patient identified problems/goals with staff: Yes. Medical problems stabilized or resolved: Yes. Denies suicidal/homicidal ideation: Yes. Issues/concerns per patient self-inventory: No.   New problem(s)  identified: No, Describe:  None  New Short Term/Long Term Goal(s): medication stabilization, elimination  of SI thoughts, development of comprehensive mental wellness plan.   Patient Goals:  "To continue to stay happy"   Discharge Plan or Barriers: Patient recently admitted. CSW will continue to follow and assess for appropriate referrals and possible discharge planning.   Reason for Continuation of Hospitalization: Anxiety Depression Medication stabilization Suicidal ideation  Estimated Length of Stay: 3 to 5 days   Attendees: Patient: Albert Gomez  11/27/2020  Physician: Bartholomew Crews, MD 11/27/2020   Nursing:  11/27/2020   RN Care Manager: 11/27/2020   Social Worker: Melba Coon, LCSWA 11/27/2020   Recreational Therapist:  11/27/2020   Other:  11/27/2020   Other:  11/27/2020   Other: 11/27/2020     Scribe for Treatment Team: Aram Beecham, LCSWA 11/27/2020 1:29 PM

## 2020-11-27 NOTE — Progress Notes (Signed)
Adult Psychoeducational Group Note  Date:  11/27/2020 Time:  10:25 AM  Group Topic/Focus:  Goals Group:   The focus of this group is to help patients establish daily goals to achieve during treatment and discuss how the patient can incorporate goal setting into their daily lives to aide in recovery.  Participation Level:  Active  Participation Quality:  Appropriate  Affect:  Appropriate  Cognitive:  Appropriate  Insight: Appropriate  Engagement in Group:  Engaged  Modes of Intervention:  Discussion  Additional Comments:  Patient attended group and participated.   Nana Vastine W Shasta Chinn 11/27/2020, 10:25 AM

## 2020-11-28 DIAGNOSIS — F32A Depression, unspecified: Secondary | ICD-10-CM

## 2020-11-28 MED ORDER — DIVALPROEX SODIUM 500 MG PO DR TAB: 500.0000 mg | DELAYED_RELEASE_TABLET | Freq: Two times a day (BID) | ORAL | 0 refills | Status: DC

## 2020-11-28 MED ORDER — HYDROXYZINE HCL 25 MG PO TABS: 25.0000 mg | ORAL_TABLET | Freq: Three times a day (TID) | ORAL | 0 refills | Status: DC | PRN

## 2020-11-28 MED ORDER — ONDANSETRON 4 MG PO TBDP: 4.0000 mg | ORAL_TABLET | Freq: Three times a day (TID) | ORAL | 0 refills | Status: DC | PRN

## 2020-11-28 MED ORDER — ESCITALOPRAM OXALATE 5 MG PO TABS: 5.0000 mg | ORAL_TABLET | Freq: Every day | ORAL | 0 refills | Status: DC

## 2020-11-28 MED ORDER — TRAZODONE HCL 150 MG PO TABS: 150.0000 mg | ORAL_TABLET | Freq: Every evening | ORAL | 0 refills | Status: DC | PRN

## 2020-11-28 NOTE — Progress Notes (Signed)
  [facility] Adult Case Management Discharge Plan :  Will you be returning to the same living situation after discharge:  Yes,  with parents At discharge, do you have transportation home?: Yes,  via mother Do you have the ability to pay for your medications: Yes,  has medicaid  Release of information consent forms completed and in the chart;  Patient's signature needed at discharge.  Patient to Follow up at:  Follow-up Information    [facility]. Go on 12/03/2020.   Specialty: Behavioral Health Why: You have an appointment to start substance abuse intensive therapy on 12/03/20 at 10:00 am.  You also have a walk in appointment for medication management on 12/22/20 at 7:45 am.  Walk in appointments are first come, first served and are held in person. Contact information: [phone] [place] [address] [phone]              Next level of care provider has access to [facility] Link:yes  Safety Planning and Suicide Prevention discussed: Yes,  with mother  Have you used any form of tobacco in the last 30 days? (Cigarettes, Smokeless Tobacco, Cigars, and/or Pipes): No ("I only smoke marijuana".)  Has patient been referred to the Quitline?: Patient refused referral  Patient has been referred for addiction treatment: Yes  Felizardo Hoffmann, LCSWA 11/28/2020, 10:20 AM

## 2020-11-28 NOTE — Progress Notes (Signed)
   11/28/20 1002  Vital Signs  Temp 98.2 F (36.8 C)  Temp Source Oral  Pulse Rate 82  BP (!) 144/93  BP Location Left Arm  BP Method Automatic  Patient Position (if appropriate) Standing  Oxygen Therapy  SpO2 99 %   Discharge Note:  Patient denies SI/HI AVH at this time. Discharge instructions, AVS, prescriptions and transition record gone over with patient. Patient agrees to comply with medication management, follow-up visit, and outpatient therapy. Patient belongings returned to patient. Pt. Talked about becoming an x-ray tech. Patient questions and concerns addressed and answered.  Patient ambulatory off unit.  Patient discharged to home with family and friend.

## 2020-11-28 NOTE — Discharge Summary (Signed)
Physician Discharge Summary Note  Patient:  Albert Gomez is an 20 y.o., male MRN:  [id number] DOB:  02/06/2001 Patient phone:  [phone] (home)  Patient address:   [address] [place] [address],  Total Time spent with patient: 30 minutes  Date of Admission:  11/25/2020 Date of Discharge: 11/28/2020  Reason for Admission: (from MD's admission note): Patient was seen and interviewed this afternoon in his room by the MD.  The patient is a 20 year old male with a history of seizure disorder, cannabis use as well as prior diagnosis of depression who presented at the recommendation of his neurologist to [facility] as a walk-in for complaints of worsening depression and suicidal ideation.  The patient states that at epilepsy clinic he was seen and "I asked for antidepressant medication and they were taking me seriously."  He presented to [facility] in an attempt to get medication for his depression.  The patient is a little tangential when recounting the history.  He reports that over the last 2 to 3 months he has been smoking marijuana while awake 24 hours a day and got to the point where "I felt like I had no emotion."  Patient states that over the 2 to 3 months of smoking marijuana his mood has worsened.  Symptoms include sad mood, anhedonia, decreased concentration, decreased appetite, feelings of worthlessness, feelings of guilt, thoughts that it would be easier not to be alive, problems with his memory.    On interview today, the patient denies any current wishes not to be alive or suicidal intent preparation or plan.  He states that he only stated that he might take pills because he was asked upon evaluation at [facility] what he would do if wishes not to be alive intensified and he did want to act on them.  Patient states he also had problems sleeping which were alleviated only by use of marijuana and that trazodone was not effective.  The patient denies  ever having homicidal or assaultive ideation past or present.  He states that before his visit to the epilepsy clinic, the patient and his mother were talking about setting up therapy online.  Patient states that he got emotional when talking to the social worker in epilepsy clinic and that is when she referred him for urgent behavioral health evaluation.  The patient states that he believes his depressive symptoms correlate with his heavy cannabis use.  When he uses cannabis depressive symptoms worsen.  He reports that he first had depressive symptoms at the age of 1 or 35 which also correlates with his use of cannabis.  He denies any paranoia, auditory or visual hallucinations, referential thinking past or present.  He does report some anxiety and worries about bills, career decisions, whether or not he will be successful.  He states that at times when he tries to go to sleep he cannot turn his thoughts off.  He denies any symptoms meeting criteria for a manic or hypomanic episode.  Patient denies any access to firearm or other weapon.  Patient reports heavy use of marijuana over the last 2 to 3 months with use approximately 24 hours a day while awake.  He reports onset of social use at age 58-1/21 years old.  He began to use marijuana consistently at age 59.  He reports 1 prior inpatient nonpsychiatric admission to [facility] at the age of 19 for sequela of smoking marijuana that had high THC content.  Patient makes mention  of receiving a diagnosis of cannabis hyperemesis syndrome at that time.  The patient denies current use of alcohol.  He reports he last had any alcohol approximately 6 months ago.  He states that alcohol use causes seizures so he stopped using it 6 to 12 months ago.  Prior to that time the most he drank was at age 17 during which she would consume 3-4 shots per weekend.  He denies a history of cocaine, heroin, ecstasy or other drug use.  He denies vaping or using tobacco products.  He drinks 1  cup of caffeinated coffee per day he denies ever participating in a substance use disorder treatment program.  Evaluation on the unit, day of discharge: Patient stated he feels better and he is safe to discharge home. He stated his step-dad will be there all the time. He stated he knows to call 911 or go to the nearest emergency room if he starts to feel suicidal again. He stated he is sleeping well and his appetite is good. He has no complaints with his medications. He has been attending group activities. He denies SI/HI/AVH, paranoia and delusions. Patient is stable for discharge home today.   Principal Problem: Depression, unspecified Discharge Diagnoses: Principal Problem:   Depression, unspecified Active Problems:   Obsessive-compulsive behavior   Anxiety state   Insomnia   Sleep arousal disorder   Suicidal ideation   Moderate cannabis use disorder (HCC)   Substance or medication-induced depressive disorder (HCC)   Generalized epilepsy (HCC)   Past Psychiatric History: Patient reports previously seeing a psychiatrist for anxiety.  States last visit with psychiatrist [name]) was about a year ago.  He was referred to the psychiatrist through the child epilepsy clinic.  He reports he was prescribed Klonopin in the past for anxiety.  He denies any other psychiatric medications or other psychiatric diagnoses.  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  The patient denies any history of psychotic symptoms or manic or hypomanic symptoms.  He reports past suicidal ideation and states that when he was on Keppra for his seizure disorder approximately 3 to 4 years ago he purchased a rope and climbed up with plans to hang himself but received a  phone call and did not proceed with the attempted hanging.  He denies other near attempts or suicide attempts.  Past Medical History:  Past Medical History:  Diagnosis Date  . Anxiety   . Concussion   . Depression   . Epilepsy (HCC)   . Obsessive-compulsive disorder   . Sleep disorder   . Vomiting     Past Surgical History:  Procedure Laterality Date  . CIRCUMCISION  2001  . HAND SURGERY    . LAPAROSCOPIC APPENDECTOMY N/A 11/26/2016   Procedure: APPENDECTOMY LAPAROSCOPIC;  Surgeon: Kandice Hams, MD;  Location: [facility] OR;  Service: General;  Laterality: N/A;   Family History:  Family History  Problem Relation Age of Onset  . Lung cancer Paternal Grandmother        Died at 27   Family Psychiatric  History: None specified in chart Social History:  Social History   Substance and Sexual Activity  Alcohol Use Yes  . Alcohol/week: 0.0 standard drinks     Social History   Substance and Sexual Activity  Drug Use Yes  . Types:  Marijuana    Social History   Socioeconomic History  . Marital status: Single    Spouse name: Not on file  . Number of children: Not on file  . Years of education: Not on file  . Highest education level: Not on file  Occupational History  . Not on file  Tobacco Use  . Smoking status: Current Every Day Smoker  . Smokeless tobacco: Never Used  Vaping Use  . Vaping Use: Never used  Substance and Sexual Activity  . Alcohol use: Yes    Alcohol/week: 0.0 standard drinks  . Drug use: Yes    Types: Marijuana  . Sexual activity: Yes    Partners: Female    Birth control/protection: Condom, Other-see comments    Comment: does not use protection every time  Other Topics Concern  . Not on file  Social History Narrative   Jade is currently working on his GED-does not attend high school.   He lives with his parents and 3 siblings.   Social Determinants of Health   Financial Resource Strain: Not on file  Food Insecurity: Not on file  Transportation  Needs: Not on file  Physical Activity: Not on file  Stress: Not on file  Social Connections: Not on file    Hospital Course:  After the above admission evaluation, Lovelle's presenting symptoms were noted. He was recommended for mood stabilization treatments. The medication regimen targeting those presenting symptoms were discussed with him/her & initiated with his consent. UDS on arrival to the ED was positive for THC and benzos, alcohol was negative. His presenting symptoms were medicated, stabilized & discharged on the medications as listed on his discharge medication lists below. Besides the mood stabilization treatments, Guillermo was also enrolled & participated in the group counseling sessions being offered & held on this unit. He learned coping skills. He presented no other significant pre-existing medical issues that required treatment. He tolerated his treatment regimen without any adverse effects or reactions reported.   During the course of his hospitalization, the 15-minute checks were adequate to ensure patient's safety. Nihaal did not display any dangerous, violent or suicidal behavior on the unit.  He interacted with patients & staff appropriately, participated appropriately in the group sessions/therapies. His medications were addressed & adjusted to meet his needs. He was recommended for outpatient follow-up care & medication management upon discharge to assure continuity of care & mood stability.  At the time of discharge patient is not reporting any acute suicidal/homicidal ideations. He feels more confident about his/her self-care & in managing his mental health. He currently denies any new issues or concerns. Education and supportive counseling provided throughout his hospital stay & upon discharge.   Today upon his discharge evaluation with the attending psychiatrist, Tevin shares he is doing well. He/She denies any other specific concerns. He is sleeping well. His appetite is good. He denies  other physical complaints. He denies AH/VH, delusional thoughts or paranoia. He does not appear to be responding to any internal stimuli. He feels that his medications have been helpful & is in agreement to continue his current treatment regimen as recommended. He was able to engage in safety planning including plan to return to [facility] or contact emergency services if he feels unable to maintain his/her own safety or the safety of others. Pt had no further questions, comments, or concerns. He left [facility] with all personal belongings in no apparent distress. Transportation per his family.    Physical Findings: AIMS:  , ,  ,  ,  CIWA:    COWS:     Musculoskeletal: Strength & Muscle Tone: within normal limits Gait & Station: normal Patient leans: N/A  Psychiatric Specialty Exam: Physical Exam Constitutional:      Appearance: Normal appearance.  HENT:     Head: Normocephalic.  Musculoskeletal:        General: Normal range of motion.     Cervical back: Normal range of motion.  Neurological:     Mental Status: He is alert and oriented to person, place, and time.  Psychiatric:        Attention and Perception: Attention normal.        Mood and Affect: Mood normal.        Speech: Speech normal.        Behavior: Behavior normal. Behavior is cooperative.        Cognition and Memory: Cognition normal.     Review of Systems  Constitutional: Negative.   HENT: Negative for rhinorrhea, sneezing and sore throat.   Respiratory: Negative for cough and shortness of breath.   Genitourinary: Negative.   Neurological: Negative.     Blood pressure (!) 156/91, pulse 76, temperature 98.2 F (36.8 C), temperature source Oral, resp. rate 18, height 6\' 1"  (1.854 m), weight 80.7 kg, SpO2 99 %.Body mass index is 23.48 kg/m.  Sleep:  Number of Hours: 6.75     Have you used any form of tobacco in the last 30 days? (Cigarettes, Smokeless Tobacco, Cigars, and/or Pipes): No ("I only smoke marijuana".)  Has  this patient used any form of tobacco in the last 30 days? (Cigarettes, Smokeless Tobacco, Cigars, and/or Pipes) Yes, N/A, patient denies tobacco use  Blood Alcohol level:  Lab Results  Component Value Date   ETH <10 11/24/2020   ETH 204 (H) 03/30/2020    Metabolic Disorder Labs:  Lab Results  Component Value Date   HGBA1C 4.9 11/26/2020   MPG 93.93 11/26/2020   No results found for: PROLACTIN Lab Results  Component Value Date   CHOL 149 11/26/2020   TRIG 53 11/26/2020   HDL 40 (L) 11/26/2020   CHOLHDL 3.7 11/26/2020   VLDL 11 11/26/2020   LDLCALC 98 11/26/2020    See Psychiatric Specialty Exam and Suicide Risk Assessment completed by Attending Physician prior to discharge.  Discharge destination:  Home  Is patient on multiple antipsychotic therapies at discharge:  No   Has Patient had three or more failed trials of antipsychotic monotherapy by history:  No  Recommended Plan for Multiple Antipsychotic Therapies: NA   Allergies as of 11/28/2020   No Known Allergies     Medication List    TAKE these medications     Indication  cloBAZam 20 MG tablet Commonly known as: ONFI Take by mouth 2 (two) times daily.  Indication: epilepsy   divalproex 500 MG DR tablet Commonly known as: DEPAKOTE Take 1 tablet (500 mg total) by mouth every 12 (twelve) hours. What changed: when to take this  Indication: Epilepsy   escitalopram 5 MG tablet Commonly known as: LEXAPRO Take 1 tablet (5 mg total) by mouth daily. Start taking on: November 29, 2020  Indication: Generalized Anxiety Disorder, Major Depressive Disorder   hydrOXYzine 25 MG tablet Commonly known as: ATARAX/VISTARIL Take 1 tablet (25 mg total) by mouth 3 (three) times daily as needed for anxiety.  Indication: Feeling Anxious   ondansetron 4 MG disintegrating tablet Commonly known as: ZOFRAN-ODT Take 1 tablet (4 mg total) by mouth every 8 (eight) hours as  needed for nausea or vomiting.  Indication: Nausea and  Vomiting   traZODone 150 MG tablet Commonly known as: DESYREL Take 1 tablet (150 mg total) by mouth at bedtime as needed for sleep. What changed:   medication strength  how much to take  Indication: Trouble Sleeping       Follow-up Information    [facility]. Go on 12/03/2020.   Specialty: Behavioral Health Why: You have an appointment to start substance abuse intensive therapy on 12/03/20 at 10:00 am.  You also have a walk in appointment for medication management on 12/22/20 at 7:45 am.  Walk in appointments are first come, first served and are held in person. Contact information: [phone] [place] [address] [phone]              Follow-up recommendations:  Activity:  as tolerated Diet:  Heart healthy  Comments:  Prescriptions given at discharge.  Patient is agreeable to discharge plan.  Patient was given the opportunity to ask questions.  He appears to feel comfortable with discharge denies any current suicidal or homicidal thoughts.   Patient is instructed prior to discharge to: Take all medications as prescribed by his mental healthcare provider. Report any adverse effects and or reactions from the medicines to his outpatient provider promptly. Patient has been instructed & cautioned: To not engage in alcohol and or illegal drug use while on prescription medicines. In the event of worsening symptoms, patient is instructed to call the crisis hotline, 911 and or go to the nearest ED for appropriate evaluation and treatment of symptoms. To follow-up with his primary care provider for your other medical issues, concerns and or health care needs.  Signed: Laveda Abbe, NP 11/28/2020, 10:44 AM

## 2020-11-28 NOTE — BHH Suicide Risk Assessment (Signed)
[facility] Discharge Suicide Risk Assessment   Principal Problem: Depression, unspecified Discharge Diagnoses: Principal Problem:   Depression, unspecified Active Problems:   Obsessive-compulsive behavior   Anxiety state   Insomnia   Sleep arousal disorder   Suicidal ideation   Moderate cannabis use disorder (HCC)   Substance or medication-induced depressive disorder (HCC)   Generalized epilepsy (HCC)  Subjective: Patient was seen today on the unit in the presence of APP. He states his nausea has resolved and he was able to eat a big breakfast today. He states he feels the best he has in the last 6 months and realizes that cannabis use was likely contributing to his depressive and anxious symptoms prior to admission. Time was spent discussing the need for abstinence from illicit substances after discharge and he agrees with plans to start a SAIOP next week. He denies SI, HI, AVH, paranoia, or delusions. He denies medication side-effects and reports feeling a benefit from his medication. He denies physical complaints today. He reports good sleep overnight.   Total Time Spent in Direct Patient Care:  I personally spent 30 minutes on the unit in direct patient care. The direct patient care time included face-to-face time with the patient, reviewing the patient's chart, communicating with other professionals, and coordinating care. Greater than 50% of this time was spent in counseling or coordinating care with the patient regarding goals of hospitalization, psycho-education, and discharge planning needs.  Musculoskeletal: Strength & Muscle Tone: within normal limits Gait & Station: normal Patient leans: N/A  Psychiatric Specialty Exam:   Blood pressure (!) 156/91, pulse 76, temperature 98.2 F (36.8 C), temperature source Oral, resp. rate 18, height 6\' 1"  (1.854 m), weight 80.7 kg, SpO2 99 %.Body mass index is 23.48 kg/m.  General Appearance: casually dressed, well groomed, appears stated age   Eye Contact::  Good  Speech:  Clear and Coherent and Normal Rate409  Volume:  Normal  Mood:  described as improved - appears euthymic  Affect:  moderate, stable, full  Thought Process:  Coherent and Goal Directed, linear  Orientation:  Full (Time, Place, and Person)  Thought Content:  Logical and and denies AVH, paranoia, or delusions; no acute psychosis noted on exam  Suicidal Thoughts:  Denied  Homicidal Thoughts:  Denied  Memory:  Recent;   Good  Judgement:  Intact  Insight:  Improved  Psychomotor Activity:  Normal  Concentration:  Good  Recall:  Good  Fund of Knowledge:Good  Language: Good  Akathisia:  Negative  Assets:  Communication Skills Desire for Improvement Housing Resilience Social Support  Sleep:  Number of Hours: 6.75  Cognition: WNL  ADL's:  Intact   Mental Status Per Nursing Assessment::   On Admission:  NA  Demographic Factors:  Male, Adolescent or young adult and Caucasian, unemployed  Loss Factors: Decrease in vocational status and Financial problems/change in socioeconomic status  Historical Factors: Impulsivity  Risk Reduction Factors:   Sense of responsibility to family, Living with another person, especially a relative and Positive social support  Continued Clinical Symptoms:  Recent THC use prior to admission; depression diagnosis  Cognitive Features That Contribute To Risk:  None    Suicide Risk:  Mild:  Suicidal ideation of limited frequency, intensity, duration, and specificity.  There are no identifiable plans, no associated intent, mild dysphoria and related symptoms, good self-control (both objective and subjective assessment), few other risk factors, and identifiable protective factors, including available and accessible social support.   Follow-up Information    [facility]  [facility]. Go on 12/03/2020.   Specialty: Behavioral Health Why: You have an appointment to start substance abuse intensive therapy on  12/03/20 at 10:00 am.  You also have a walk in appointment for medication management on 12/22/20 at 7:45 am.  Walk in appointments are first come, first served and are held in person. Contact information: [phone] [place] [address] [phone]              Plan Of Care/Follow-up recommendations:  Activity:  as tolerated Diet:  heart healthy Other:  Patient advised to keep scheduled outpatient SAIOP appointment after discharge for addictions treatment and mental health outpatient services. He was advised to abstain from illicit drugs and alcohol and was encouraged to be medication compliant. He was advised to see his PCP/neurologist for f/u of his seizure d/o as an outpatient without fail. The FDA black box warning for potential risk of developing suicidal thinking in patients in his age range who are started on antidepressants was discussed and he was advised to monitor closely for suicidal thinking after discharge. Safety plan was reviewed with the patient and he agrees to stay wiht mother and step-father after release.  Comer Locket, MD, FAPA 11/28/2020, 10:08 AM

## 2020-11-28 NOTE — Progress Notes (Signed)
Adult Psychoeducational Group Note  Date:  11/28/2020 Time:  10:38 AM  Group Topic/Focus:  Dimensions of Wellness:   The focus of this group is to introduce the topic of wellness and discuss the role each dimension of wellness plays in total health.  Participation Level:  Active  Participation Quality:  Appropriate  Affect:  Appropriate  Cognitive:  Appropriate  Insight: Appropriate  Engagement in Group:  Engaged  Modes of Intervention:  Discussion  Additional Comments:   Patient attended morning orientation and Wellness group and participated.  Albert Gomez W [name] 11/28/2020, 10:38 AM

## 2020-11-28 NOTE — BHH Suicide Risk Assessment (Signed)
[facility] INPATIENT:  Family/Significant Other Suicide Prevention Education  Suicide Prevention Education:  Education Completed;  [place] [phone]),  (name of family member/significant other) has been identified by the patient as the family member/significant other with whom the patient will be residing, and identified as the person(s) who will aid the patient in the event of a mental health crisis (suicidal ideations/suicide attempt).  With written consent from the patient, the family member/significant other has been provided the following suicide prevention education, prior to the and/or following the discharge of the patient.  The suicide prevention education provided includes the following:  Suicide risk factors  Suicide prevention and interventions  National Suicide Hotline telephone number  [facility] assessment telephone number  [facility] Emergency Assistance 911  [facility] and/or Residential Mobile Crisis Unit telephone number  Request made of family/significant other to:  Remove weapons (e.g., guns, rifles, knives), all items previously/currently identified as safety concern.    Remove drugs/medications (over-the-counter, prescriptions, illicit drugs), all items previously/currently identified as a safety concern.  The family member/significant other verbalizes understanding of the suicide prevention education information provided.  The family member/significant other agrees to remove the items of safety concern listed above.  Pt's mother states that she can pick-up pt and that she currently has no safety concerns. Pt's mother states that pt has lived with her for the last year and her and the family have arranged for someone to be home with pt at all times post-discharge. No weapons in the home and all medications have been secured. CSW reviewed pt's follow-up appointments with his mother.  Felizardo Hoffmann 11/28/2020, 10:33 AM

## 2020-12-03 ENCOUNTER — Ambulatory Visit (HOSPITAL_COMMUNITY): Payer: Medicaid Other | Admitting: Behavioral Health

## 2020-12-24 ENCOUNTER — Other Ambulatory Visit (HOSPITAL_COMMUNITY): Payer: Self-pay | Admitting: Psychiatry

## 2020-12-28 ENCOUNTER — Other Ambulatory Visit (HOSPITAL_COMMUNITY): Payer: Self-pay | Admitting: Psychiatry

## 2021-01-28 ENCOUNTER — Other Ambulatory Visit (HOSPITAL_COMMUNITY): Payer: Self-pay | Admitting: Psychiatry

## 2021-03-28 ENCOUNTER — Emergency Department (HOSPITAL_COMMUNITY)
Admission: EM | Admit: 2021-03-28 | Discharge: 2021-03-29 | Disposition: A | Discharge status: Home / Self Care | Payer: Medicaid Other | Attending: Emergency Medicine | Admitting: Emergency Medicine

## 2021-03-28 DIAGNOSIS — G40909 Epilepsy, unspecified, not intractable, without status epilepticus: Secondary | ICD-10-CM | POA: Diagnosis not present

## 2021-03-28 DIAGNOSIS — Y907 Blood alcohol level of 200-239 mg/100 ml: Secondary | ICD-10-CM | POA: Diagnosis not present

## 2021-03-28 DIAGNOSIS — R45851 Suicidal ideations: Secondary | ICD-10-CM | POA: Insufficient documentation

## 2021-03-28 DIAGNOSIS — F4323 Adjustment disorder with mixed anxiety and depressed mood: Secondary | ICD-10-CM | POA: Diagnosis not present

## 2021-03-28 DIAGNOSIS — F10929 Alcohol use, unspecified with intoxication, unspecified: Secondary | ICD-10-CM

## 2021-03-28 DIAGNOSIS — F17200 Nicotine dependence, unspecified, uncomplicated: Secondary | ICD-10-CM | POA: Diagnosis not present

## 2021-03-28 DIAGNOSIS — R569 Unspecified convulsions: Secondary | ICD-10-CM | POA: Diagnosis present

## 2021-03-28 DIAGNOSIS — F10129 Alcohol abuse with intoxication, unspecified: Secondary | ICD-10-CM | POA: Diagnosis not present

## 2021-03-28 DIAGNOSIS — Z79899 Other long term (current) drug therapy: Secondary | ICD-10-CM | POA: Insufficient documentation

## 2021-03-28 DIAGNOSIS — Z20822 Contact with and (suspected) exposure to covid-19: Secondary | ICD-10-CM | POA: Insufficient documentation
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
## 2021-03-29 ENCOUNTER — Other Ambulatory Visit: Payer: Self-pay

## 2021-03-29 ENCOUNTER — Encounter (HOSPITAL_COMMUNITY): Payer: Self-pay | Admitting: Emergency Medicine

## 2021-03-29 ENCOUNTER — Emergency Department (HOSPITAL_COMMUNITY): Payer: Medicaid Other

## 2021-03-29 LAB — CBC WITH DIFFERENTIAL/PLATELET
Abs Immature Granulocytes: 0.02 10*3/uL (ref 0.00–0.07)
Basophils Absolute: 0 10*3/uL (ref 0.0–0.1)
Basophils Relative: 0 %
Eosinophils Absolute: 0 10*3/uL (ref 0.0–0.5)
Eosinophils Relative: 1 %
HCT: 46.1 % (ref 39.0–52.0)
Hemoglobin: 16.7 g/dL (ref 13.0–17.0)
Immature Granulocytes: 0 %
Lymphocytes Relative: 36 %
Lymphs Abs: 2.3 10*3/uL (ref 0.7–4.0)
MCH: 32.4 pg (ref 26.0–34.0)
MCHC: 36.2 g/dL — ABNORMAL HIGH (ref 30.0–36.0)
MCV: 89.5 fL (ref 80.0–100.0)
Monocytes Absolute: 0.5 10*3/uL (ref 0.1–1.0)
Monocytes Relative: 7 %
Neutro Abs: 3.4 10*3/uL (ref 1.7–7.7)
Neutrophils Relative %: 56 %
Platelets: 194 10*3/uL (ref 150–400)
RBC: 5.15 MIL/uL (ref 4.22–5.81)
RDW: 11 % — ABNORMAL LOW (ref 11.5–15.5)
WBC: 6.2 10*3/uL (ref 4.0–10.5)
nRBC: 0 % (ref 0.0–0.2)

## 2021-03-29 LAB — COMPREHENSIVE METABOLIC PANEL
ALT: 26 U/L (ref 0–44)
AST: 32 U/L (ref 15–41)
Albumin: 4.5 g/dL (ref 3.5–5.0)
Alkaline Phosphatase: 60 U/L (ref 38–126)
Anion gap: 12 (ref 5–15)
BUN: 11 mg/dL (ref 6–20)
CO2: 26 mmol/L (ref 22–32)
Calcium: 9 mg/dL (ref 8.9–10.3)
Chloride: 104 mmol/L (ref 98–111)
Creatinine, Ser: 1.03 mg/dL (ref 0.61–1.24)
GFR, Estimated: >60 mL/min (ref >60)
Glucose, Bld: 94 mg/dL (ref 70–99)
Potassium: 3.6 mmol/L (ref 3.5–5.1)
Sodium: 142 mmol/L (ref 135–145)
Total Bilirubin: 0.8 mg/dL (ref 0.3–1.2)
Total Protein: 7.1 g/dL (ref 6.5–8.1)

## 2021-03-29 LAB — ETHANOL: Alcohol, Ethyl (B): 224 mg/dL — ABNORMAL HIGH (ref <10)

## 2021-03-29 LAB — RAPID URINE DRUG SCREEN, HOSP PERFORMED
Amphetamines: NOT DETECTED
Barbiturates: NOT DETECTED
Benzodiazepines: POSITIVE — AB
Cocaine: NOT DETECTED
Opiates: NOT DETECTED
Tetrahydrocannabinol: NOT DETECTED

## 2021-03-29 LAB — URINALYSIS, ROUTINE W REFLEX MICROSCOPIC
Bilirubin Urine: NEGATIVE
Glucose, UA: NEGATIVE mg/dL
Hgb urine dipstick: NEGATIVE
Ketones, ur: NEGATIVE mg/dL
Leukocytes,Ua: NEGATIVE
Nitrite: NEGATIVE
Protein, ur: NEGATIVE mg/dL
Specific Gravity, Urine: 1.003 — ABNORMAL LOW (ref 1.005–1.030)
pH: 8 (ref 5.0–8.0)

## 2021-03-29 LAB — VALPROIC ACID LEVEL: Valproic Acid Lvl: 84 ug/mL (ref 50.0–100.0)

## 2021-03-29 LAB — SALICYLATE LEVEL: Salicylate Lvl: <7 mg/dL — ABNORMAL LOW (ref 7.0–30.0)

## 2021-03-29 LAB — ACETAMINOPHEN LEVEL: Acetaminophen (Tylenol), Serum: <10 ug/mL — ABNORMAL LOW (ref 10–30)

## 2021-03-29 LAB — RESP PANEL BY RT-PCR (FLU A&B, COVID) ARPGX2
Influenza A by PCR: NEGATIVE
Influenza B by PCR: NEGATIVE
SARS Coronavirus 2 by RT PCR: NEGATIVE

## 2021-03-29 LAB — LIPASE, BLOOD: Lipase: 115 U/L — ABNORMAL HIGH (ref 11–51)

## 2021-03-29 MED ORDER — DIVALPROEX SODIUM 250 MG PO DR TAB: 500.0000 mg | DELAYED_RELEASE_TABLET | Freq: Once | ORAL | Status: DC

## 2021-03-29 MED ORDER — ZIPRASIDONE MESYLATE 20 MG IM SOLR
20.0000 mg | Freq: Once | INTRAMUSCULAR | Status: AC
  Administered 2021-03-29: 20 mg via INTRAMUSCULAR
  Filled 2021-03-29: qty 20

## 2021-03-29 MED ORDER — LORAZEPAM 2 MG/ML IJ SOLN
1.0000 mg | Freq: Once | INTRAMUSCULAR | Status: AC
  Administered 2021-03-29: 01:00:00 1 mg via INTRAVENOUS
  Filled 2021-03-29: qty 1

## 2021-03-29 MED ORDER — STERILE WATER FOR INJECTION IJ SOLN
INTRAMUSCULAR | Status: AC
  Administered 2021-03-29: 10 mL
  Filled 2021-03-29: qty 10

## 2021-03-29 MED ORDER — DIVALPROEX SODIUM 250 MG PO DR TAB
500.0000 mg | DELAYED_RELEASE_TABLET | Freq: Once | ORAL | Status: AC
  Administered 2021-03-29: 500 mg via ORAL
  Filled 2021-03-29: qty 2

## 2021-03-29 NOTE — ED Notes (Signed)
TTS in progress 

## 2021-03-29 NOTE — Discharge Instructions (Addendum)
Follow-up with the referred resources.  If it anytime, you feel like you need counseling, help, he can follow-up with behavioral health urgent care.  Make sure you are taking your seizure medication.  Return emergency department for any thoughts of wanting her to kill yourself, thoughts of wanting to hurt or kill anyone else, seizure activity, any other worsening concerning symptoms.

## 2021-03-29 NOTE — ED Notes (Signed)
[facility] aware patient is ready for TTS

## 2021-03-29 NOTE — ED Provider Notes (Signed)
[facility] EMERGENCY DEPARTMENT Provider Note   CSN: [id number] Arrival date & time:        History Chief Complaint  Patient presents with   Seizures    Albert Gomez is a 20 y.o. male presents to the Emergency Department via EMS for seizures.  Patient does not remember the event.  Reports he has been drinking tonight but cannot remember what or how much.  Reports that he feels suicidal and does not want to live anymore.  Reports that his family stresses too much but he does not have a plan.  Will 5 caveat for altered mental status.  Per EMS, they were called out for patient riding [place] the back of [place]  Fire department reported 3 tonic-clonic seizures to EMS and EMS reports an additional 2 with him.  Patient was given 5 mg of Versed IM with resolution of seizure-like activity.  Per friends on scene patient has a history of epilepsy and cannot take Keppra but they do not know why.    The history is provided by the patient and medical records. No language interpreter was used.      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   Seizure (HCC) 06/03/2018   Vomiting 06/03/2018   Elevated lipase    Acute appendicitis, uncomplicated 11/26/2016   Problems with learning 09/14/2016   Insomnia 09/14/2016   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   Central auditory processing disorder 02/13/2015   Obsessive-compulsive behavior 02/13/2015   Transient alteration of awareness  02/13/2015   Anxiety state 02/13/2015    Past Surgical History:  Procedure Laterality Date   CIRCUMCISION  2001   HAND SURGERY     LAPAROSCOPIC APPENDECTOMY N/A 11/26/2016   Procedure: APPENDECTOMY LAPAROSCOPIC;  Surgeon: Kandice Hams, MD;  Location: [facility] OR;  Service: General;  Laterality: N/A;       Family History  Problem Relation Age of Onset   Lung cancer Paternal Grandmother        Died at 6    Social History   Tobacco Use   Smoking status: Every Day    Pack years: 0.00   Smokeless tobacco: Never  Vaping Use   Vaping Use: Never used  Substance Use Topics   Alcohol use: Yes    Alcohol/week: 0.0 standard drinks   Drug use: Yes    Types: Marijuana    Home Medications Prior to Admission medications   Medication Sig Start Date End Date Taking? Authorizing Provider  cloBAZam (ONFI) 20 MG tablet Take by mouth 2 (two) times daily.    [provider]  divalproex (DEPAKOTE) 500 MG DR tablet Take 1 tablet (500 mg total) by mouth every 12 (twelve) hours. 11/28/20   Laveda Abbe, NP  escitalopram (LEXAPRO) 5 MG tablet Take 1 tablet (5 mg total) by mouth daily. 11/29/20   Laveda Abbe, NP  hydrOXYzine (ATARAX/VISTARIL) 25 MG tablet Take 1 tablet (25 mg total) by mouth 3 (three) times  daily as needed for anxiety. 11/28/20   Laveda AbbeParks, Laurie Britton, NP  ondansetron (ZOFRAN-ODT) 4 MG disintegrating tablet Take 1 tablet (4 mg total) by mouth every 8 (eight) hours as needed for nausea or vomiting. 11/28/20   Laveda AbbeParks, Laurie Britton, NP  traZODone (DESYREL) 150 MG tablet Take 1 tablet (150 mg total) by mouth at bedtime as needed for sleep. 11/28/20   Laveda AbbeParks, Laurie Britton, NP    Allergies    Patient has no known allergies.  Review of Systems   Review of Systems  Constitutional:  Negative for appetite change, diaphoresis, fatigue, fever and unexpected weight change.  HENT:  Negative for mouth sores.   Eyes:  Negative for visual disturbance.  Respiratory:  Negative  for cough, chest tightness, shortness of breath and wheezing.   Cardiovascular:  Negative for chest pain.  Gastrointestinal:  Negative for abdominal pain, constipation, diarrhea, nausea and vomiting.  Endocrine: Negative for polydipsia, polyphagia and polyuria.  Genitourinary:  Negative for dysuria, frequency, hematuria and urgency.  Musculoskeletal:  Negative for back pain and neck stiffness.  Skin:  Negative for rash.  Allergic/Immunologic: Negative for immunocompromised state.  Neurological:  Positive for seizures. Negative for syncope, light-headedness and headaches.  Hematological:  Does not bruise/bleed easily.  Psychiatric/Behavioral:  Positive for suicidal ideas. Negative for sleep disturbance. The patient is not nervous/anxious.    Physical Exam Updated Vital Signs BP 115/60   Pulse 81   Resp 16   SpO2 98%   Physical Exam Vitals and nursing note reviewed.  Constitutional:      General: He is not in acute distress.    Appearance: He is not diaphoretic.  HENT:     Head: Normocephalic.  Eyes:     General: No scleral icterus.    Conjunctiva/sclera: Conjunctivae normal.  Cardiovascular:     Rate and Rhythm: Normal rate and regular rhythm.     Pulses: Normal pulses.          Radial pulses are 2+ on the right side and 2+ on the left side.  Pulmonary:     Effort: No tachypnea, accessory muscle usage, prolonged expiration, respiratory distress or retractions.     Breath sounds: No stridor.     Comments: Equal chest rise. No increased work of breathing. Abdominal:     General: There is no distension.     Palpations: Abdomen is soft.     Tenderness: There is no abdominal tenderness. There is no guarding or rebound.  Musculoskeletal:     Cervical back: Normal range of motion.     Comments: Moves all extremities equally and without difficulty.  Skin:    General: Skin is warm and dry.     Capillary Refill: Capillary refill takes less than 2 seconds.  Neurological:      Mental Status: He is alert.     GCS: GCS eye subscore is 4. GCS verbal subscore is 5. GCS motor subscore is 6.     Comments: Speech is slightly slurred but goal oriented.  Sensation intact in the bilateral upper and lower extremities.  Strength 5/5 in the upper and lower extremities.  Patient does follow commands.  Seems somewhat confused and is unable to relay tonight's events but does know his name and the year.  Psychiatric:        Mood and Affect: Mood normal.    ED Results / Procedures / Treatments   Labs (all labs ordered are listed, but only abnormal results are displayed) Labs Reviewed  CBC  WITH DIFFERENTIAL/PLATELET - Abnormal; Notable for the following components:      Result Value   MCHC 36.2 (*)    RDW 11.0 (*)    All other components within normal limits  LIPASE, BLOOD - Abnormal; Notable for the following components:   Lipase 115 (*)    All other components within normal limits  ETHANOL - Abnormal; Notable for the following components:   Alcohol, Ethyl (B) 224 (*)    All other components within normal limits  URINALYSIS, ROUTINE W REFLEX MICROSCOPIC - Abnormal; Notable for the following components:   Color, Urine COLORLESS (*)    Specific Gravity, Urine 1.003 (*)    All other components within normal limits  RAPID URINE DRUG SCREEN, HOSP PERFORMED - Abnormal; Notable for the following components:   Benzodiazepines POSITIVE (*)    All other components within normal limits  SALICYLATE LEVEL - Abnormal; Notable for the following components:   Salicylate Lvl <7.0 (*)    All other components within normal limits  ACETAMINOPHEN LEVEL - Abnormal; Notable for the following components:   Acetaminophen (Tylenol), Serum <10 (*)    All other components within normal limits  RESP PANEL BY RT-PCR (FLU A&B, COVID) [id number]  COMPREHENSIVE METABOLIC PANEL  VALPROIC ACID LEVEL    EKG EKG Interpretation  Date/Time:  Sunday March 28 2021 23:59:06 EDT Ventricular Rate:  83 PR  Interval:  154 QRS Duration: 84 QT Interval:  353 QTC Calculation: 415 R Axis:   87 Text Interpretation: Sinus rhythm ST elev, probable normal early repol pattern No significant change was found Confirmed by Kennis Carina [phone]) on 03/29/2021 1:13:44 AM  Radiology DG Chest Port 1 View  Result Date: 03/29/2021 CLINICAL DATA:  Seizure EXAM: PORTABLE CHEST 1 VIEW COMPARISON:  02/12/2017 FINDINGS: The heart size and mediastinal contours are within normal limits. Both lungs are clear. The visualized skeletal structures are unremarkable. IMPRESSION: No active disease. Electronically Signed   By: Deatra Robinson M.D.   On: 03/29/2021 01:06    Procedures Procedures   Medications Ordered in ED Medications  divalproex (DEPAKOTE) DR tablet 500 mg (500 mg Oral Patient Refused/Not Given 03/29/21 0227)  LORazepam (ATIVAN) injection 1 mg (1 mg Intravenous Given 03/29/21 0112)  ziprasidone (GEODON) injection 20 mg (20 mg Intramuscular Given 03/29/21 0306)  sterile water (preservative free) injection (10 mLs  Given 03/29/21 0307)    ED Course  I have reviewed the triage vital signs and the nursing notes.  Pertinent labs & imaging results that were available during my care of the patient were reviewed by me and considered in my medical decision making (see chart for details).    MDM Rules/Calculators/A&P                          Patient presents to the emergency department via EMS after 6 witnessed seizures.  Patient heavily intoxicated.  Received Versed by EMS and is sleepy here upon arrival.  Does not remember the event.  Reports he regularly smokes marijuana but cannot remember if he smoked any tonight.  2:39 AM Multiple attempts to redirect patient over the last several hours.  He is agitated, remains intoxicated and is unwilling to stay for evaluation. He remains too intoxicated to make his own decisions, has a history of suicidal ideation and is endorsing suicidal thoughts tonight. Patient placed under  IVC.   6:07 AM Pt resting at this time.  Awaiting TTS.  At shift change care transferred  to the oncoming team. At this time no additional seizure activity.  If he remains at neurological baseline dispo per TTS.    Final Clinical Impression(s) / ED Diagnoses Final diagnoses:  Seizure (HCC)  Alcoholic intoxication with complication [facility])  Suicidal ideation    Rx / DC Orders ED Discharge Orders     None        Wyolene Weimann, Boyd Kerbs 03/29/21 1448    Sabas Sous, MD 03/29/21 [phone]

## 2021-03-29 NOTE — ED Notes (Signed)
Pt and mother to the desk, mother stated that pt is becoming destructive and she can't keep him here. Pt and mother seen leaving ED. PA made aware.

## 2021-03-29 NOTE — BH Assessment (Signed)
Comprehensive Clinical Assessment (CCA) Note  03/29/2021 Dmani Mizer [id number]  Chief Complaint:  Chief Complaint  Patient presents with   Seizures   Visit Diagnosis: F43.23 Adjustment Disorder with mixed anxiety and depressed mood  Disposition: Shuvon Rankin, NP recommends psychiatric clearance. Pt to follow up with his therapist and neurologist as needed    Flowsheet Row ED from 03/28/2021 in [facility] EMERGENCY [facility]PARTMENT Admission (Dis[facility]) [facility] from 11/24/2020 in [facility] EMERGENCY DEPARTMENT  C-SSRS RISK CATEGORY Moderate Risk Error: Q7 should not be populated when Q6 is No No Risk      Therefore for suicide precaution 2:1 sitter is recommended  The patient demonstrates the following risk factors for suicide: Chronic risk factors for suicide include: psychiatric disorder of Depression and medical illness Epilespy . Acute risk factors for suicide include: recent discharge from inpatient psychiatry. Protective factors for this patient include: positive social support, positive therapeutic relationship, responsibility to others (children, family), coping skills, hope for the future, and life satisfaction. Considering these factors, the overall suicide risk at this point appears to be low. Patient is appropriate for outpatient follow up.     CCA Screening, Triage and Referral (STR)  Patient Reported Information How did you hear about [place]? Hospital Discharge  What Is the Reason for Your Visit/Call Today? 20 y.o. male presents to the Emergency Department via EMS for seizures.  Patient does not remember the event.  Reports he has been drinking tonight but cannot remember what or how much.  Reports that he feels suicidal and does not want to live anymore.  Reports that his family stresses too much but he does not have a plan  How Long Has This Been Causing You Problems? <Week  What  Do You Feel Would Help You the Most Today? -- (none needed per pt)   Have You Recently Had Any Thoughts About Hurting Yourself? No  Are You Planning to Commit Suicide/Harm Yourself At This time? No   Have you Recently Had Thoughts About Hurting Someone Karolee Ohs? No  Are You Planning to Harm Someone at This Time? No  Explanation: No data recorded  Have You Used Any Alcohol or Drugs in the Past 24 Hours? Yes  How Long Ago Did You Use Drugs or Alcohol? No data recorded What Did You Use and How Much? pt states he "overdrank" mixed drinks last night   Do You Currently Have a Therapist/Psychiatrist? No (pt states he can connect with therapist he used to see through my chart if necessary)  Have You Been Recently Discharged From Any Office Practice or Programs? No      CCA Screening Triage Referral Assessment Type of Contact: Face-to-Face  Location of Assessment: [facility] ED  Provider Location: [facility] Assessment Services   Collateral Involvement: mother- [place] Citizen   Is CPS involved or ever been involved? Never  Is APS involved or ever been involved? Never   Patient Determined To Be At Risk for Harm To Self or Others Based on Review of Patient Reported Information or Presenting Complaint? No  Who Could Verify You Are Able To Have These Secured: No data recorded Do You Have any Outstanding Charges, Pending Court Dates, Parole/Probation? No data recorded Contacted To Inform of Risk of Harm To Self or Others: Family/Significant Other:    Does Patient Present under Involuntary Commitment? No  IVC Papers Initial File Date: No data recorded  [place] of Residence: [address]  Patient Currently Receiving the Following Services: Medication Management   Determination of Need: Urgent (48 hours)   Options For Referral: Medication Management   CCA Biopsychosocial Patient Reported Schizophrenia/Schizoaffective Diagnosis in Past: No   Strengths: "I can handle a bad situation pretty  decently due to epilepsy experience"   Mental Health Symptoms Depression:   Change in energy/activity (increased energy; sleep is good with melatonin; intentional small wt gain)   Duration of Depressive symptoms:  Duration of Depressive Symptoms: N/A   Mania:   None   Anxiety:    Worrying   Psychosis:   None   Duration of Psychotic symptoms:    Trauma:   None   Obsessions:   None   Compulsions:   None   Inattention:   None   Hyperactivity/Impulsivity:   N/A   Oppositional/Defiant Behaviors:   N/A   Emotional Irregularity:   N/A   Other Mood/Personality Symptoms:  No data recorded   Mental Status Exam Appearance and self-care  Stature:   Average   Weight:   Average weight   Clothing:   Neat/clean   Grooming:   Normal   Cosmetic use:   None   Posture/gait:   Normal; Tense   Motor activity:   Not Remarkable   Sensorium  Attention:   Normal   Concentration:   Normal   Orientation:   X5   Recall/memory:   Normal   Affect and Mood  Affect:   Appropriate   Mood:   Anxious; Euthymic   Relating  Eye contact:   Normal   Facial expression:   Tense; Responsive   Attitude toward examiner:   Cooperative   Thought and Language  Speech flow:  Soft; Clear and Coherent   Thought content:   Appropriate to Mood and Circumstances   Preoccupation:   None   Hallucinations:   None   Organization:  No data recorded  [employer] of Knowledge:   Good   Intelligence:   Average   Abstraction:   Normal   Judgement:   Common-sensical   Reality Testing:   Realistic   Insight:   Good   Decision Making:   Normal   Social Functioning  Social Maturity:   Responsible   Social Judgement:   Normal   Stress  Stressors:   Illness   Coping Ability:   Normal   Skill Deficits:   Interpersonal   Supports:   Family; Friends/Service system (mother, friends, siblings and girlfriend)      Religion: Religion/Spirituality Are You A Religious Person?: No  Leisure/Recreation: Leisure / Recreation Do You Have Hobbies?: Yes Leisure and Hobbies: Working out  Exercise/Diet: Exercise/Diet Do You Exercise?: Yes What Type of Exercise Do You Do?: Weight Training, Run/Walk How Many Times a Week Do You Exercise?: 4-5 times a week Have You Gained or Lost A Significant Amount of Weight in the Past Six Months?: No (intentionally gained a little weight) Do You Follow a Special Diet?: No Do You Have Any Trouble Sleeping?: No (good sleep - with melatonin)   CCA Employment/Education Employment/Work Situation: Employment / Work Psychologist, 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?: No  Education: Education Is Patient Currently Attending School?: Yes School Currently Attending: [employer] Last  Grade Completed: 12 Did You Attend College?: Yes What Type of College Degree Do you Have?: did not complete due to epilepsy Did You Have An Individualized Education Program (IIEP): No Did You Have Any Difficulty At School?: No Patient's Education Has Been Impacted by Current Illness: No    CCA Substance Use Alcohol/Drug Use: Alcohol / Drug Use Pain Medications: see MAR Prescriptions: see MAR Over the Counter: see MAR History of alcohol / drug use?: Yes Longest period of sobriety (when/how long): 7-8 months; drinks intermittently Negative Consequences of Use:  (feels sick after drinking; health risks) Withdrawal Symptoms: None Substance #1 Name of Substance 1: alcohol 1 - Age of First Use: 14 1 - Frequency: 1-2 x monthly 1 - Last Use / Amount: last night      ASAM's:  Six Dimensions of Multidimensional  Assessment  Dimension 1:  Acute Intoxication and/or Withdrawal Potential:   Dimension 1:  Description of individual's past and current experiences of substance use and withdrawal: current use  Dimension 2:  Biomedical Conditions and Complications:   Dimension 2:  Description of patient's biomedical conditions and  complications: epilepsy  Dimension 3:  Emotional, Behavioral, or Cognitive Conditions and Complications:  Dimension 3:  Description of emotional, behavioral, or cognitive conditions and complications: stabilized mood; hopeful  Dimension 4:  Readiness to Change:  Dimension 4:  Description of Readiness to Change criteria: contemplative  Dimension 5:  Relapse, Continued use, or Continued Problem Potential:  Dimension 5:  Relapse, continued use, or continued problem potential critiera description: contemplating stopping  Dimension 6:  Recovery/Living Environment:  Dimension 6:  Recovery/Iiving environment criteria description: lives with mother, safe, stable  ASAM Severity Score: ASAM's Severity Rating Score: 5  ASAM Recommended Level of Treatment: ASAM Recommended Level of Treatment: Level I Outpatient Treatment   Substance use Disorder (SUD) Substance Use Disorder (SUD)  Checklist Symptoms of Substance Use: Substance(s) often taken in larger amounts or over longer times than was intended, Continued use despite having a persistent/recurrent physical/psychological problem caused/exacerbated by use  Recommendations for Services/Supports/Treatments: Recommendations for Services/Supports/Treatments Recommendations For Services/Supports/Treatments: Medication Management, Individual Therapy   DSM5 Diagnoses: Patient Active Problem List   Diagnosis Date Noted   Moderate cannabis use disorder (HCC) 11/25/2020   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

## 2021-03-29 NOTE — ED Notes (Signed)
Patient is sleeping , very difficult to arouse very sleepy, sitter at bedside. Mother left at staff request and all patient belongings taken home by mom.

## 2021-03-29 NOTE — BH Assessment (Signed)
TTS attempted to complete assessment. Per Elliot Gurney, RN, patient received Geodon and unable to participate in assessment. TTS will attempt at later time.

## 2021-03-29 NOTE — ED Notes (Signed)
Pt and mom updated on POC and IVC papers process, pt and family provided with coffee requested while they are waiting for disposition, pt cooperative on bed, mom at the bedside, pt denies any suicidal ideation at this time no aggressive behavior

## 2021-03-29 NOTE — ED Notes (Signed)
Pt repeatedly leaves room and asks to leave.  Pt refuses to keep monitoring equipment on.  Mom at bedside.  Provider spoke with patient multiple times and can be re directed to room temporarily.

## 2021-03-29 NOTE — ED Notes (Signed)
Chancy Milroy fiance [phone] would like an update on the patient

## 2021-03-29 NOTE — ED Provider Notes (Signed)
Care assumed from [facility], PA-C at shift change with TTS eval pending.   In brief, this patient is a 20 y.o. M who presents for ETOH intoxication, seizures. Patient does endorse ETOH use last night. He has a history of seizures with ETOH use. He had a witnessed seizure by GF and then 3 witnessed by fire and 2 witnessed by EMS. He was given 5mg  of versed. On ED arrival, patient was post ictal. He is SI on ED arrival. Patient cannot have keppra. He was given Depakoe here in the ED. He has difficulty remembering anything.     Physical Exam  BP 114/69 (BP Location: Left Arm)   Pulse 83   Temp 97.6 F (36.4 C) (Oral)   Resp 17   SpO2 95%   Physical Exam Normal gait   ED Course/Procedures     Procedures  Results for orders placed or performed during the hospital encounter of 03/28/21 (from the past 24 hour(s))  CBC with Differential/Platelet     Status: Abnormal   Collection Time: 03/29/21 12:14 AM  Result Value Ref Range   WBC 6.2 4.0 - 10.5 K/uL   RBC 5.15 4.22 - 5.81 MIL/uL   Hemoglobin 16.7 13.0 - 17.0 g/dL   HCT 05/30/21 56.3 - 87.5 %   MCV 89.5 80.0 - 100.0 fL   MCH 32.4 26.0 - 34.0 pg   MCHC 36.2 (H) 30.0 - 36.0 g/dL   RDW 64.3 (L) 32.9 - 51.8 %   Platelets 194 150 - 400 K/uL   nRBC 0.0 0.0 - 0.2 %   Neutrophils Relative % 56 %   Neutro Abs 3.4 1.7 - 7.7 K/uL   Lymphocytes Relative 36 %   Lymphs Abs 2.3 0.7 - 4.0 K/uL   Monocytes Relative 7 %   Monocytes Absolute 0.5 0.1 - 1.0 K/uL   Eosinophils Relative 1 %   Eosinophils Absolute 0.0 0.0 - 0.5 K/uL   Basophils Relative 0 %   Basophils Absolute 0.0 0.0 - 0.1 K/uL   Immature Granulocytes 0 %   Abs Immature Granulocytes 0.02 0.00 - 0.07 K/uL  Comprehensive metabolic panel     Status: None   Collection Time: 03/29/21 12:14 AM  Result Value Ref Range   Sodium 142 135 - 145 mmol/L   Potassium 3.6 3.5 - 5.1 mmol/L   Chloride 104 98 - 111 mmol/L   CO2 26 22 - 32 mmol/L   Glucose, Bld 94 70 - 99 mg/dL   BUN 11  6 - 20 mg/dL   Creatinine, Ser 05/30/21 0.61 - 1.24 mg/dL   Calcium 9.0 8.9 - 6.60 mg/dL   Total Protein 7.1 6.5 - 8.1 g/dL   Albumin 4.5 3.5 - 5.0 g/dL   AST 32 15 - 41 U/L   ALT 26 0 - 44 U/L   Alkaline Phosphatase 60 38 - 126 U/L   Total Bilirubin 0.8 0.3 - 1.2 mg/dL   GFR, Estimated 63.0 >16 mL/min   Anion gap 12 5 - 15  Lipase, blood     Status: Abnormal   Collection Time: 03/29/21 12:14 AM  Result Value Ref Range   Lipase 115 (H) 11 - 51 U/L  Ethanol     Status: Abnormal   Collection Time: 03/29/21 12:14 AM  Result Value Ref Range   Alcohol, Ethyl (B) 224 (H) <10 mg/dL  Urinalysis, Routine w reflex microscopic     Status: Abnormal   Collection Time: 03/29/21 12:14 AM  Result Value Ref Range  Color, Urine COLORLESS (A) YELLOW   APPearance CLEAR CLEAR   Specific Gravity, Urine 1.003 (L) 1.005 - 1.030   pH 8.0 5.0 - 8.0   Glucose, UA NEGATIVE NEGATIVE mg/dL   Hgb urine dipstick NEGATIVE NEGATIVE   Bilirubin Urine NEGATIVE NEGATIVE   Ketones, ur NEGATIVE NEGATIVE mg/dL   Protein, ur NEGATIVE NEGATIVE mg/dL   Nitrite NEGATIVE NEGATIVE   Leukocytes,Ua NEGATIVE NEGATIVE  Rapid urine drug screen (hospital performed)     Status: Abnormal   Collection Time: 03/29/21 12:14 AM  Result Value Ref Range   Opiates NONE DETECTED NONE DETECTED   Cocaine NONE DETECTED NONE DETECTED   Benzodiazepines POSITIVE (A) NONE DETECTED   Amphetamines NONE DETECTED NONE DETECTED   Tetrahydrocannabinol NONE DETECTED NONE DETECTED   Barbiturates NONE DETECTED NONE DETECTED  Salicylate level     Status: Abnormal   Collection Time: 03/29/21 12:14 AM  Result Value Ref Range   Salicylate Lvl <7.0 (L) 7.0 - 30.0 mg/dL  Acetaminophen level     Status: Abnormal   Collection Time: 03/29/21 12:14 AM  Result Value Ref Range   Acetaminophen (Tylenol), Serum <10 (L) 10 - 30 ug/mL  Valproic acid level     Status: None   Collection Time: 03/29/21 12:14 AM  Result Value Ref Range   Valproic Acid Lvl 84 50.0  - 100.0 ug/mL  Resp Panel by RT-PCR (Flu A&B, Covid) Nasopharyngeal Swab     Status: None   Collection Time: 03/29/21  3:18 AM   Specimen: Nasopharyngeal Swab; Nasopharyngeal(NP) swabs in vial transport medium  Result Value Ref Range   SARS Coronavirus 2 by RT PCR NEGATIVE NEGATIVE   Influenza A by PCR NEGATIVE NEGATIVE   Influenza B by PCR NEGATIVE NEGATIVE     MDM    PLAN: TTS eval is pending. If TTS clears patient and is seizure free and able to walk and talk without any difficulty, patient can be discahrged home.   MDM: Per Deidre Mcarther LCSW patient has been psychiatrically cleared.  Patient to follow-up with outpatient psych and neurology.  I discussed with both patient and mom at bedside.  Patient to me permission to speak in front of mom.  Patient is clinically sober with no slurred speech.  He is able to ambulate without any signs of gait ataxia.  I discussed with patient regarding SI.  He does not remember making the statements.  I asked him currently if he has any suicidal ideations and he denies any SI, HI.  IVC will be rescinded.  Patient instructed follow-up with outpatient resources, neurology. Patient had ample opportunity for questions and discussion. All patient's questions were answered with full understanding. Strict return precautions discussed. Patient expresses understanding and agreement to plan.     1. Seizure (HCC)   2. Alcoholic intoxication with complication (HCC)   3. Suicidal ideation    Portions of this note were generated with Scientist, clinical (histocompatibility and immunogenetics). Dictation errors may occur despite best attempts at proofreading.     Albert Caul, PA-C 03/29/21 1610    Terald Sleeper, MD 03/29/21 [phone]

## 2021-07-08 ENCOUNTER — Other Ambulatory Visit: Payer: Self-pay

## 2021-07-08 ENCOUNTER — Emergency Department
Admission: EM | Admit: 2021-07-08 | Discharge: 2021-07-08 | Disposition: A | Discharge status: Home / Self Care | Payer: Medicaid Other | Attending: Emergency Medicine | Admitting: Emergency Medicine

## 2021-07-08 ENCOUNTER — Emergency Department: Payer: Medicaid Other

## 2021-07-08 DIAGNOSIS — Z79899 Other long term (current) drug therapy: Secondary | ICD-10-CM | POA: Diagnosis not present

## 2021-07-08 DIAGNOSIS — W1830XA Fall on same level, unspecified, initial encounter: Secondary | ICD-10-CM | POA: Insufficient documentation

## 2021-07-08 DIAGNOSIS — S62340A Nondisplaced fracture of base of second metacarpal bone, right hand, initial encounter for closed fracture: Secondary | ICD-10-CM | POA: Insufficient documentation

## 2021-07-08 DIAGNOSIS — Y9361 Activity, american tackle football: Secondary | ICD-10-CM | POA: Insufficient documentation

## 2021-07-08 DIAGNOSIS — F17200 Nicotine dependence, unspecified, uncomplicated: Secondary | ICD-10-CM | POA: Diagnosis not present

## 2021-07-08 DIAGNOSIS — M79641 Pain in right hand: Secondary | ICD-10-CM | POA: Insufficient documentation

## 2021-07-08 DIAGNOSIS — S6991XA Unspecified injury of right wrist, hand and finger(s), initial encounter: Secondary | ICD-10-CM | POA: Diagnosis present

## 2021-07-08 MED ORDER — CYCLOBENZAPRINE HCL 5 MG PO TABS: 5.0000 mg | ORAL_TABLET | Freq: Three times a day (TID) | ORAL | 0 refills | Status: DC | PRN

## 2021-07-08 NOTE — ED Provider Notes (Signed)
[facility] Emergency Department Provider Note ____________________________________________  Time seen: 2032  I have reviewed the triage vital signs and the nursing notes.  HISTORY  Chief Complaint  Hand Pain   HPI Albert Gomez is a 20 y.o. male presents to the ED for evaluation of right hand injury.  Patient was playing football tonight, when apparently fell on his right hand, and arose with pain and swelling to the palm region.  He reports being able to manipulate the fingers without difficulty.  Denies any other injury at this time.  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   Seizure (HCC) 06/03/2018   Vomiting 06/03/2018   Elevated lipase    Acute appendicitis, uncomplicated 11/26/2016   Problems with learning 09/14/2016   Insomnia 09/14/2016   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 Authorizing Provider  cyclobenzaprine (FLEXERIL) 5 MG tablet Take 1 tablet (5 mg total) by mouth 3 (three) times  daily as needed. 07/08/21  Yes Maha Fischel, Charlesetta Ivory, PA-C  cloBAZam (ONFI) 20 MG tablet Take by mouth 2 (two) times daily.    [provider]  divalproex (DEPAKOTE) 500 MG DR tablet Take 1 tablet (500 mg total) by mouth every 12 (twelve) hours. 11/28/20   Laveda Abbe, NP  escitalopram (LEXAPRO) 5 MG tablet Take 1 tablet (5 mg total) by mouth daily. 11/29/20   Laveda Abbe, NP  hydrOXYzine (ATARAX/VISTARIL) 25 MG tablet Take 1 tablet (25 mg total) by mouth 3 (three) times daily as needed for anxiety. 11/28/20   Laveda Abbe, NP  ondansetron (ZOFRAN-ODT) 4 MG disintegrating tablet Take 1 tablet (4 mg total) by mouth every 8 (eight) hours as needed for nausea or vomiting. 11/28/20   Laveda Abbe, NP  traZODone (DESYREL) 150 MG tablet Take 1 tablet (150 mg total) by mouth at bedtime as needed for sleep. 11/28/20   Laveda Abbe, NP    Allergies Patient has no known allergies.  Family History  Problem Relation Age of Onset   Lung cancer Paternal Grandmother        Died at 66    Social History Social History   Tobacco Use   Smoking status: Every Day   Smokeless tobacco: Never  Vaping Use   Vaping Use: Never used  Substance Use Topics   Alcohol use: Yes    Alcohol/week: 0.0 standard drinks   Drug use: Yes    Types: Marijuana    Review of Systems  Constitutional: Negative for fever. Eyes:  Negative for visual changes. ENT: Negative for sore throat. Cardiovascular: Negative for chest pain. Respiratory: Negative for shortness of breath. Gastrointestinal: Negative for abdominal pain, vomiting and diarrhea. Genitourinary: Negative for dysuria. Musculoskeletal: Negative for back pain. Right hand pain as above Skin: Negative for rash. Neurological: Negative for headaches, focal weakness or numbness. ____________________________________________  PHYSICAL EXAM:  VITAL SIGNS: ED Triage Vitals  Enc Vitals Group     BP 07/08/21 1915 (!)  154/76     Pulse Rate 07/08/21 1915 79     Resp 07/08/21 1915 18     Temp 07/08/21 1915 98.6 F (37 C)     Temp Source 07/08/21 1915 Oral     SpO2 07/08/21 1915 100 %     Weight 07/08/21 1913 190 lb (86.2 kg)     Height 07/08/21 1913 6\' 1"  (1.854 m)     Head Circumference --      Peak Flow --      Pain Score 07/08/21 1913 2     Pain Loc --      Pain Edu? --      Excl. in GC? --     Constitutional: Alert and oriented. Well appearing and in no distress. Head: Normocephalic and atraumatic. Eyes: Conjunctivae are normal. Normal extraocular movements Cardiovascular: Normal rate, regular rhythm. Normal distal pulses. Respiratory: Normal respiratory effort.  Musculoskeletal: Nontender with normal range of motion in all extremities.  Neurologic:  Normal gait without ataxia. Normal speech and language. No gross focal neurologic deficits are appreciated. Skin:  Skin is warm, dry and intact. No rash noted. Psychiatric: Mood and affect are normal. Patient exhibits appropriate insight and judgment. ____________________________________________    {LABS (pertinent positives/negatives)  ____________________________________________  {EKG  ____________________________________________   RADIOLOGY Official radiology report(s): DG Hand Complete Right  Result Date: 07/08/2021 CLINICAL DATA:  Football injury EXAM: RIGHT HAND - COMPLETE 3+ VIEW COMPARISON:  12/20/2016. FINDINGS: Obliquely oriented fracture through the proximal aspect of the second metacarpal diaphysis, mildly displaced. No additional fracture is seen. This does not appear to involve the articular surface. Adjacent soft tissue swelling. IMPRESSION: Acute, mildly displaced fracture of the second metacarpal. Electronically Signed   By: 12/22/2016 M.D.   On: 07/08/2021 19:54   ____________________________________________  PROCEDURES  .Splint Application  Date/Time: 07/08/2021 8:43 PM Performed by: 07/10/2021,  PA-C Authorized by: Lissa Hoard, PA-C   Consent:    Consent obtained:  Verbal   Consent given by:  Patient   Risks, benefits, and alternatives were discussed: yes     Risks discussed:  Pain Universal protocol:    Imaging studies available: yes     Patient identity confirmed:  Verbally with patient Pre-procedure details:    Distal neurologic exam:  Normal   Distal perfusion: distal pulses strong   Procedure details:    Location:  Hand   Hand location:  R hand   Strapping: no     Splint type:  Radial gutter   Supplies:  Cotton padding, fiberglass and elastic bandage   Attestation: Splint applied and adjusted personally by me   Post-procedure details:    Distal neurologic exam:  Normal   Distal perfusion: distal pulses strong     Procedure completion:  Tolerated well, no immediate complications ____________________________________________   INITIAL IMPRESSION / ASSESSMENT AND PLAN / ED COURSE  As part of my medical decision making, I reviewed the following data within the electronic MEDICAL RECORD NUMBER Radiograph reviewed as noted and Notes from  prior ED visits   Patient with initial fracture management of a closed minimally displaced oblique fracture of the second metacarpal the right hand.  He is placed in a radial gutter splint for protection.  He is referred to Ortho for further evaluation and fracture care.  A prescription for Flexeril is provided for his benefit.   Albert Gomez was evaluated in Emergency Department on 07/08/2021 for the symptoms described in the history of present illness. He was evaluated in the context of the global COVID-19 pandemic, which necessitated consideration that the patient might be at risk for infection with the SARS-CoV-2 virus that causes COVID-19. Institutional protocols and algorithms that pertain to the evaluation of patients at risk for COVID-19 are in a state of rapid change based on information released by regulatory bodies  including the CDC and federal and state organizations. These policies and algorithms were followed during the patient's care in the ED.   ____________________________________________  FINAL CLINICAL IMPRESSION(S) / ED DIAGNOSES  Final diagnoses:  Closed nondisplaced fracture of base of second metacarpal bone of right hand, initial encounter      Lissa Hoard, PA-C 07/08/21 2105    Delton Prairie, MD 07/08/21 2110

## 2021-07-08 NOTE — ED Notes (Signed)
Splint placed by provider JM.

## 2021-07-08 NOTE — Discharge Instructions (Addendum)
Keep the splint clean and dry. Take OTC Tylenol along with the prescription Flexeril for pain relief. Follow-up with Dr. Maxcine Ham for continued fracture care.

## 2021-07-08 NOTE — ED Triage Notes (Signed)
Pt states he was playing football tonight, fell on his right hand and now has pain and swelling. Pt able to move fingers.

## 2021-07-22 ENCOUNTER — Other Ambulatory Visit (HOSPITAL_COMMUNITY): Payer: Self-pay | Admitting: Psychiatry

## 2021-08-02 NOTE — Telephone Encounter (Signed)
Patient should follow up with their outpatient provider for any prescription refills. 

## 2021-09-08 ENCOUNTER — Other Ambulatory Visit: Payer: Self-pay | Admitting: Family Medicine

## 2021-09-08 ENCOUNTER — Ambulatory Visit
Admission: RE | Admit: 2021-09-08 | Discharge: 2021-09-08 | Disposition: A | Discharge status: Home / Self Care | Payer: Medicaid Other | Source: Ambulatory Visit | Attending: Family Medicine | Admitting: Family Medicine

## 2021-09-08 DIAGNOSIS — R059 Cough, unspecified: Secondary | ICD-10-CM

## 2022-01-13 ENCOUNTER — Other Ambulatory Visit: Payer: Self-pay | Admitting: Family Medicine

## 2022-01-13 DIAGNOSIS — M25551 Pain in right hip: Secondary | ICD-10-CM

## 2022-01-23 ENCOUNTER — Other Ambulatory Visit: Payer: Medicaid Other

## 2022-02-17 ENCOUNTER — Ambulatory Visit
Admission: RE | Admit: 2022-02-17 | Discharge: 2022-02-17 | Disposition: A | Discharge status: Home / Self Care | Payer: Medicaid Other | Source: Ambulatory Visit | Attending: Family Medicine | Admitting: Family Medicine

## 2022-02-17 DIAGNOSIS — M25551 Pain in right hip: Secondary | ICD-10-CM

## 2022-03-14 ENCOUNTER — Encounter: Payer: Self-pay | Admitting: Neurology

## 2022-04-04 ENCOUNTER — Ambulatory Visit (INDEPENDENT_AMBULATORY_CARE_PROVIDER_SITE_OTHER): Payer: Self-pay | Admitting: Neurology

## 2022-04-04 ENCOUNTER — Encounter: Payer: Self-pay | Admitting: Neurology

## 2022-04-04 VITALS — BP 141/78 | HR 60 | Ht 73.0 in | Wt 198.4 lb

## 2022-04-04 DIAGNOSIS — F32A Depression, unspecified: Secondary | ICD-10-CM

## 2022-04-04 DIAGNOSIS — G40309 Generalized idiopathic epilepsy and epileptic syndromes, not intractable, without status epilepticus: Secondary | ICD-10-CM

## 2022-04-04 MED ORDER — DIVALPROEX SODIUM 500 MG PO DR TAB: 500.0000 mg | DELAYED_RELEASE_TABLET | Freq: Two times a day (BID) | ORAL | 11 refills | Status: DC

## 2022-04-04 MED ORDER — CLOBAZAM 20 MG PO TABS: ORAL_TABLET | ORAL | 5 refills | Status: DC

## 2022-04-04 MED ORDER — FLUOXETINE HCL 20 MG PO CAPS: 20.0000 mg | ORAL_CAPSULE | Freq: Every day | ORAL | 11 refills | Status: DC

## 2022-04-04 NOTE — Patient Instructions (Signed)
Good to meet you.  Take the clobazam 20mg : 2 tablets every night and see how you feel with the drowsiness  2. Continue Depakote 500mg  twice a day  3. Refills sent for Prozac 20mg  daily  4. Follow-up in 3 months, call for any changes   Seizure Precautions: 1. If medication has been prescribed for you to prevent seizures, take it exactly as directed.  Do not stop taking the medicine without talking to your doctor first, even if you have not had a seizure in a long time.   2. Avoid activities in which a seizure would cause danger to yourself or to others.  Don't operate dangerous machinery, swim alone, or climb in high or dangerous places, such as on ladders, roofs, or girders.  Do not drive unless your doctor says you may.  3. If you have any warning that you may have a seizure, lay down in a safe place where you can't hurt yourself.    4.  No driving for 6 months from last seizure, as per [facility].   Please refer to the following link on the [employer]'s website for more information: http://www.epilepsyfoundation.org/answerplace/Social/driving/drivingu.cfm   5.  Maintain good sleep hygiene. Avoid alcohol.  6.  Contact your doctor if you have any problems that may be related to the medicine you are taking.  7.  Call 911 and bring the patient back to the ED if:        A.  The seizure lasts longer than 5 minutes.       B.  The patient doesn't awaken shortly after the seizure  C.  The patient has new problems such as difficulty seeing, speaking or moving  D.  The patient was injured during the seizure  E.  The patient has a temperature over 102 F (39C)  F.  The patient vomited and now is having trouble breathing

## 2022-04-04 NOTE — Progress Notes (Signed)
NEUROLOGY CONSULTATION NOTE  Albert Gomez MRN: [id number] DOB: 2001/08/18  Referring provider: Chalmers Cater, NP Primary care provider: Dr. Windle Gomez  Reason for consult:  establish care for seizures   Thank you for your kind referral of Albert Gomez for consultation of the above symptoms. Although his history is well known to you, please allow me to reiterate it for the purpose of our medical record. The patient was accompanied to the clinic by his girlfriend Albert Gomez who also provides collateral information. Records and images were personally reviewed where available.   HISTORY OF PRESENT ILLNESS: This is a 21 year old right-handed man with a history of depression, presenting to establish care for epilepsy. He is accompanied by his girlfriend Albert Gomez who helps supplement the history. Records from [facility] were reviewed and will be summarized below. Seizures started at age 21. He has a very brief 3-5 second warning where it feels like something is behind his head then he would lose consciousness and witnesses report convulsive activity. 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 He recalls taking Keppra and Phenobarbital at one point. He reports that he was on Keppra and Onfi, then at age [age] he was switched from Keppra to Depakote due to Keppra rage. He is on Depakote 500mg  BID and Onfi 20mg  BID. They note that the convulsions significant reduced in intensity with Depakote, Albert Gomez notes he would have brief body jerking. He had 2 seizures in 2022, one on 03/29/21 in the setting of alcohol use, then the last one was around 06/2021 which was a nocturnal seizure triggered by sleep deprivation. After a seizure, he feels a weird feeling in his head, feeling spaced out and dizzy the whole day, just wanting to stay in bed. He and his girlfriend deny any staring/unresponsive episodes, gaps in time, olfactory/gustatory hallucinations. He has myoclonic jerks when he is  sleep deprived. He tries hard to get 8 hours of sleep because he know that if he gets less, he would tend to have a seizure.   He denies any headaches, dizziness, diplopia, dysarthria/dysphagia, neck/back pain, bowel/bladder dysfunction. Mood is okay, Albert Gomez reports he is moody sometimes. Memory is "terrible." He notes he is always sleepy and tired despite getting 8 hours of sleep. He snores when lying on his back. He lives with 05/30/21 and his mother and brother. He is driving. He hopes to work for [employer] part-time.   Epilepsy Risk Factors:  Strong family history of seizures in his mother (started at age 73, off VPA since age 34), mother's uncle, maternal aunt and cousin. He had head injuries from football (concussion in the 10th grade). He had a normal birth and early development.  There is no history of febrile convulsions, CNS infections such as meningitis/encephalitis, significant traumatic brain injury, neurosurgical procedures.   Prior ASMs: Keppra ("Keppra rage"), Phenobarbital  Diagnostic Data: EEGs: Routine EEG in 02/2017 showed interictal generalized but frontally predominant spike and wave discharges. In association with photic stimulation at frequencies from 9-19 Hz a photoparoxymal response was often noted which consisted of brief bursts of generalized spike and wave discharges. Only on one occasion at 19 Hz does the discharge outlast the photic stimulation. With hyperventilation, there was a burst of irregularly occurring fairly generalized spike and wave discharges with a frequency of 2-3 Hz lasting 3-4 seconds, no grossly evident behavioral correlate but no testing done.  2-day EMU evaluation at [facility] in April 2021 showed interictal discharges consistent with a primary generalized epilepsy. Push  button events of aura and diffuse shaking had no EEG correlate. There was nonrhythmic shaking/jerking, tearfulness. They have not happened since April 2021   MRI of brain done 10/08/19 reported  subcentimeter left anterior horn/periventricular cystic lesions are unchanged from 2019 imaging.   PAST MEDICAL HISTORY: Past Medical History:  Diagnosis Date   Anxiety    Concussion    Depression    Epilepsy ([place])    Obsessive-compulsive disorder    Sleep disorder    Vomiting     PAST SURGICAL HISTORY: Past Surgical History:  Procedure Laterality Date   CIRCUMCISION  2001   HAND SURGERY     LAPAROSCOPIC APPENDECTOMY N/A 11/26/2016   Procedure: APPENDECTOMY LAPAROSCOPIC;  Surgeon: Stanford Scotland, MD;  Location: [place];  Service: General;  Laterality: N/A;    MEDICATIONS: Current Outpatient Medications on File Prior to Visit  Medication Sig Dispense Refill   cloBAZam (ONFI) 20 MG tablet Take by mouth 2 (two) times daily.     cyclobenzaprine (FLEXERIL) 5 MG tablet Take 1 tablet (5 mg total) by mouth 3 (three) times daily as needed. 15 tablet 0   divalproex (DEPAKOTE) 500 MG DR tablet Take 1 tablet (500 mg total) by mouth every 12 (twelve) hours. 60 tablet 0   FLUoxetine (PROZAC) 20 MG capsule Take 20 mg by mouth daily.     No current facility-administered medications on file prior to visit.    ALLERGIES: No Known Allergies  FAMILY HISTORY: Family History  Problem Relation Age of Onset   Lung cancer Paternal 80        Died at 67    SOCIAL HISTORY: Social History   Socioeconomic History   Marital status: Single    Spouse name: Not on file   Number of children: Not on file   Years of education: Not on file   Highest education level: Not on file  Occupational History   Not on file  Tobacco Use   Smoking status: Every Day   Smokeless tobacco: Never  Vaping Use   Vaping Use: Never used  Substance and Sexual Activity   Alcohol use: Yes    Alcohol/week: 0.0 standard drinks of alcohol   Drug use: Yes    Types: Marijuana   Sexual activity: Yes    Partners: Female    Birth control/protection: Condom, Other-see comments    Comment: does not use protection  every time  Other Topics Concern   Not on file  Social History Narrative   Shandell is currently working on his GED-does not attend high school.   He lives with his parents and 3 siblings.   Right handed   Social Determinants of Health   Financial Resource Strain: Not on file  Food Insecurity: Not on file  Transportation Needs: Not on file  Physical Activity: Not on file  Stress: Not on file  Social Connections: Not on file  Intimate Partner Violence: Not on file     PHYSICAL EXAM: Vitals:   04/04/22 0854  BP: (!) 141/78  Pulse: 60  SpO2: 99%   General: No acute distress Head:  Normocephalic. Abrasion under left eye. Skin/Extremities: No rash, no edema Neurological Exam: Mental status: alert and oriented to person, place, and time, no dysarthria or aphasia, Fund of knowledge is appropriate.  Recent and remote memory are intact, 3/3 delayed recall.  Attention and concentration are normal, 5/5 WORLD backwards. Cranial nerves: CN I: not tested CN II: pupils equal, round and reactive to light, visual fields intact  CN III, IV, VI:  full range of motion, no nystagmus, no ptosis CN V: facial sensation intact CN VII: upper and lower face symmetric CN VIII: hearing intact to conversation Bulk & Tone: normal, no fasciculations. Motor: 5/5 throughout with no pronator drift. Sensation: intact to light touch, cold, pin, vibration sense.  No extinction to double simultaneous stimulation.  Romberg test negative Deep Tendon Reflexes: +2 throughout Cerebellar: no incoordination on finger to nose testing Gait: narrow-based and steady, able to tandem walk adequately. Tremor: none   IMPRESSION: This is a 21 year old right-handed man with a history of depression, primary generalized epilepsy, presenting to establish care. They deny any seizures since around October 2022. He has been on Depakote 500mg  BID and Clobazam 20mg  BID. He is reporting daytime drowsiness, possibly due to clobazam,  although Depakote can also cause drowsiness. We discussed taking clobazam 20mg : 2 tabs qhs, continue Depakote 500mg  BID. They wonder about continued need for Clobazam since the Depakote seems to have helped most. Of note, he was on both medications with his last seizure but was sleep deprived. We discussed making one change at a time, may consider reducing clobazam dose on next visit, understanding risks for breakthrough seizure with any medication adjustment. Sleep study may also be an option if no change in symptoms despite medication adjustment. We discussed avoidance of seizure triggers, including missing medication, sleep deprivation, and alcohol.  [place] driving laws were discussed with the patient, and he knows to stop driving after a seizure, until 6 months seizure-free. He reports his psychiatrist has moved practices, refills sent for Prozac 20mg  daily. Follow-up in 3 months, call for any changes.    Thank you for allowing me to participate in the care of this patient. Please do not hesitate to call for any questions or concerns.   , M.D.  CC: Dr. , , NP

## 2022-04-04 NOTE — Progress Notes (Signed)
Pt stated that his last seizure was 6 months ago when out of town with his dad

## 2022-07-07 ENCOUNTER — Encounter: Payer: Self-pay | Admitting: Neurology

## 2022-07-07 ENCOUNTER — Telehealth (INDEPENDENT_AMBULATORY_CARE_PROVIDER_SITE_OTHER): Payer: Self-pay | Admitting: Neurology

## 2022-07-07 VITALS — Ht 73.0 in | Wt 200.0 lb

## 2022-07-07 DIAGNOSIS — G40309 Generalized idiopathic epilepsy and epileptic syndromes, not intractable, without status epilepticus: Secondary | ICD-10-CM

## 2022-07-07 MED ORDER — CLOBAZAM 20 MG PO TABS: ORAL_TABLET | ORAL | 5 refills | Status: DC

## 2022-07-07 MED ORDER — DIVALPROEX SODIUM 500 MG PO DR TAB: 500.0000 mg | DELAYED_RELEASE_TABLET | Freq: Two times a day (BID) | ORAL | 11 refills | Status: DC

## 2022-07-07 NOTE — Progress Notes (Signed)
Virtual Visit via Video Note The purpose of this virtual visit is to provide medical care while limiting exposure to the novel coronavirus.    Consent was obtained for video visit:  Yes.   Answered questions that patient had about telehealth interaction:  Yes.   I discussed the limitations, risks, security and privacy concerns of performing an evaluation and management service by telemedicine. I also discussed with the patient that there may be a patient responsible charge related to this service. The patient expressed understanding and agreed to proceed.  Pt location: Home Physician Location: office Name of referring provider:  Leonard Downing, * I connected with Tawanna Solo Davlin at patients initiation/request on 07/07/2022 at 12:30 PM EDT by video enabled telemedicine application and verified that I am speaking with the correct person using two identifiers. Pt MRN:  [id number] Pt DOB:  10-01-2000 Video Participants:  Tawanna Solo Shearman   History of Present Illness:  The patient had a virtual video visit on 07/07/2022. He is alone for today's visit. Since his last visit, he denies any seizures or seizure-like symptoms since 06/2021. He was reporting daytime drowsiness and fatigue despite getting 8 hours of sleep. We discussed taking all his clobazam at night, 40mg  qhs. He is also on Depakote 500mg  BID. He has not noticed any change in the daytime drowsiness, he gets 7-9 hours of sleep. He denies any staring/unresponsive episodes, gaps in time, olfactory/gustatory hallucinations, focal numbness/tingling/weakness, myoclonic jerks. No headaches, dizziness, vision changes, no falls.    History on Initial Assessment 04/04/2022: This is a 21 year old right-handed man with a history of depression, presenting to establish care for epilepsy. He is accompanied by his girlfriend [name] who helps supplement the history. Records from [facility] were reviewed and will be summarized below. Seizures started  at age 61. He has a very brief 3-5 second warning where it feels like something is behind his head then he would lose consciousness and witnesses report convulsive activity. 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 He recalls taking Keppra and Phenobarbital at one point. He reports that he was on Keppra and Onfi, then at age [age] he was switched from [place] to Depakote due to [place] rage. He is on Depakote 500mg  BID and Onfi 20mg  BID. They note that the convulsions significant reduced in intensity with Depakote, [name] notes he would have brief body jerking. He had 2 seizures in 2022, one on 03/29/21 in the setting of alcohol use, then the last one was around 06/2021 which was a nocturnal seizure triggered by sleep deprivation. After a seizure, he feels a weird feeling in his head, feeling spaced out and dizzy the whole day, just wanting to stay in bed. He and his girlfriend deny any staring/unresponsive episodes, gaps in time, olfactory/gustatory hallucinations. He has myoclonic jerks when he is sleep deprived. He tries hard to get 8 hours of sleep because he know that if he gets less, he would tend to have a seizure.   He denies any headaches, dizziness, diplopia, dysarthria/dysphagia, neck/back pain, bowel/bladder dysfunction. Mood is okay, [name] reports he is moody sometimes. Memory is "terrible." He notes he is always sleepy and tired despite getting 8 hours of sleep. He snores when lying on his back. He lives with Rae Mar and his mother and brother. He is driving. He hopes to work for [employer] part-time.   Epilepsy Risk Factors:  Strong family history of seizures in his mother (started at age 16, off VPA since age  6), mother's uncle, maternal aunt and cousin. He had head injuries from football (concussion in the 10th grade). He had a normal birth and early development.  There is no history of febrile convulsions, CNS infections such as meningitis/encephalitis, significant traumatic brain  injury, neurosurgical procedures.   Prior ASMs: Keppra ("Keppra rage"), Phenobarbital  Diagnostic Data: EEGs: Routine EEG in 02/2017 showed interictal generalized but frontally predominant spike and wave discharges. In association with photic stimulation at frequencies from 9-19 Hz a photoparoxymal response was often noted which consisted of brief bursts of generalized spike and wave discharges. Only on one occasion at 19 Hz does the discharge outlast the photic stimulation. With hyperventilation, there was a burst of irregularly occurring fairly generalized spike and wave discharges with a frequency of 2-3 Hz lasting 3-4 seconds, no grossly evident behavioral correlate but no testing done.  2-day EMU evaluation at [facility] in April 2021 showed interictal discharges consistent with a primary generalized epilepsy. Push button events of aura and diffuse shaking had no EEG correlate. There was nonrhythmic shaking/jerking, tearfulness. They have not happened since April 2021   MRI of brain done 10/08/19 reported subcentimeter left anterior horn/periventricular cystic lesions are unchanged from 2019 imaging.    Current Outpatient Medications on File Prior to Visit  Medication Sig Dispense Refill   cloBAZam (ONFI) 20 MG tablet Take 2 tablets every night 60 tablet 5   cyclobenzaprine (FLEXERIL) 5 MG tablet Take 1 tablet (5 mg total) by mouth 3 (three) times daily as needed. 15 tablet 0   divalproex (DEPAKOTE) 500 MG DR tablet Take 1 tablet (500 mg total) by mouth 2 (two) times daily. 60 tablet 11   FLUoxetine (PROZAC) 20 MG capsule Take 1 capsule (20 mg total) by mouth daily. 30 capsule 11   No current facility-administered medications on file prior to visit.     Observations/Objective:   Vitals:   07/07/22 1140  Weight: 200 lb (90.7 kg)  Height: 6\' 1"  (1.854 m)   GEN:  The patient appears stated age and is in NAD.  Neurological examination: Patient is awake, alert. No aphasia or dysarthria.  Intact fluency and comprehension. Cranial nerves: Extraocular movements intact. No facial asymmetry. Motor: moves all extremities symmetrically, at least anti-gravity x 4.    Assessment and Plan:   This is a 21 yo RH man with a history of depression, presenting for follow-up for primary generalized epilepsy. He continues to do well seizure-free since October 2022 on Depakote 500mg  BID and Clobazam 20mg  2 tabs qhs (40mg  qhs). He continues to report daytime drowsiness and fatigue despite taking clobazam all at night. We discussed doing a sleep study once insurance issues allow. They felt Depakote has been more helpful with seizure control, we agreed to reduce Clobazam to 30mg  qhs (20mg  1.5 tabs qhs) and monitor for any improvement in drowsiness. He is aware of [place] driving laws to stop driving after a seizure until 6 months seizure-free. Follow-up in 6 months, call for any changes.    Follow Up Instructions:    -I discussed the assessment and treatment plan with the patient. The patient was provided an opportunity to ask questions and all were answered. The patient agreed with the plan and demonstrated an understanding of the instructions.   The patient was advised to call back or seek an in-person evaluation if the symptoms worsen or if the condition fails to improve as anticipated.     Cameron Sprang, MD

## 2022-07-13 NOTE — Patient Instructions (Signed)
Good to see you.  Reduce Clobazam 20mg : Take 1 and 1/2 tablets every night. See if this helps with daytime drowsiness and fatigue  2. Continue Depakote 500mg  twice a day  3. Follow-up in 6 months, call for any changes.    Seizure Precautions: 1. If medication has been prescribed for you to prevent seizures, take it exactly as directed.  Do not stop taking the medicine without talking to your doctor first, even if you have not had a seizure in a long time.   2. Avoid activities in which a seizure would cause danger to yourself or to others.  Don't operate dangerous machinery, swim alone, or climb in high or dangerous places, such as on ladders, roofs, or girders.  Do not drive unless your doctor says you may.  3. If you have any warning that you may have a seizure, lay down in a safe place where you can't hurt yourself.    4.  No driving for 6 months from last seizure, as per [facility].   Please refer to the following link on the [place] website for more information: http://[email or web address].org/answerplace/Social/driving/drivingu.cfm   5.  Maintain good sleep hygiene. Avoid alcohol.  6.  Contact your doctor if you have any problems that may be related to the medicine you are taking.  7.  Call 911 and bring the patient back to the ED if:        A.  The seizure lasts longer than 5 minutes.       B.  The patient doesn't awaken shortly after the seizure  C.  The patient has new problems such as difficulty seeing, speaking or moving  D.  The patient was injured during the seizure  E.  The patient has a temperature over 102 F (39C)  F.  The patient vomited and now is having trouble breathing

## 2023-01-31 ENCOUNTER — Ambulatory Visit: Payer: Medicaid Other | Admitting: Neurology

## 2023-01-31 ENCOUNTER — Encounter: Payer: Self-pay | Admitting: Neurology

## 2023-05-04 ENCOUNTER — Other Ambulatory Visit (HOSPITAL_BASED_OUTPATIENT_CLINIC_OR_DEPARTMENT_OTHER): Payer: Self-pay

## 2023-05-04 ENCOUNTER — Other Ambulatory Visit: Payer: Self-pay

## 2023-05-04 ENCOUNTER — Encounter (HOSPITAL_BASED_OUTPATIENT_CLINIC_OR_DEPARTMENT_OTHER): Payer: Self-pay | Admitting: Emergency Medicine

## 2023-05-04 ENCOUNTER — Emergency Department (HOSPITAL_BASED_OUTPATIENT_CLINIC_OR_DEPARTMENT_OTHER)
Admission: EM | Admit: 2023-05-04 | Discharge: 2023-05-04 | Disposition: A | Discharge status: Home / Self Care | Payer: Medicaid Other | Attending: Emergency Medicine | Admitting: Emergency Medicine

## 2023-05-04 DIAGNOSIS — L02411 Cutaneous abscess of right axilla: Secondary | ICD-10-CM | POA: Insufficient documentation

## 2023-05-04 DIAGNOSIS — L723 Sebaceous cyst: Secondary | ICD-10-CM

## 2023-05-04 MED ORDER — DOXYCYCLINE HYCLATE 100 MG PO CAPS
100.0000 mg | ORAL_CAPSULE | Freq: Two times a day (BID) | ORAL | 0 refills | Status: DC
  Filled 2023-05-04: qty 10, 5d supply, fill #0

## 2023-05-04 MED ORDER — LIDOCAINE-EPINEPHRINE (PF) 2 %-1:200000 IJ SOLN
10.0000 mL | Freq: Once | INTRAMUSCULAR | Status: AC
  Administered 2023-05-04: 10 mL
  Filled 2023-05-04: qty 20

## 2023-05-04 NOTE — ED Triage Notes (Signed)
Pt arrives to ED with c/o abscess to right underarm x1 week.

## 2023-05-04 NOTE — ED Provider Notes (Signed)
[place] EMERGENCY DEPARTMENT AT [facility] Provider Note   CSN: [id number] Arrival date & time: 05/04/23  1433     History  Chief Complaint  Patient presents with   Abscess    Albert Gomez is a 22 y.o. male.  Patient presents to the emergency department for evaluation of swelling in the right axilla.  Patient has noted a small knot in the axilla for couple of years.  Over the past 1 to 2 weeks it has become more swollen and painful.  He was seen at an outside urgent care and placed on doxycycline.  He states that they tried to squeeze it to express the material at that time, but denies formal incision and drainage.  No fevers.  The areas become more red and swollen and tender.  No treatments prior to arrival.       Home Medications Prior to Admission medications   Medication Sig Start Date End Date Taking? Authorizing Provider  cloBAZam (ONFI) 20 MG tablet Take 1 and 1/2 tablets every night 07/07/22   Van Clines, MD  cyclobenzaprine (FLEXERIL) 5 MG tablet Take 1 tablet (5 mg total) by mouth 3 (three) times daily as needed. 07/08/21   Menshew, Charlesetta Ivory, PA-C  divalproex (DEPAKOTE) 500 MG DR tablet Take 1 tablet (500 mg total) by mouth 2 (two) times daily. 07/07/22   Van Clines, MD  FLUoxetine (PROZAC) 20 MG capsule Take 1 capsule (20 mg total) by mouth daily. 04/04/22   Van Clines, MD      Allergies    Patient has no known allergies.    Review of Systems   Review of Systems  Physical Exam Updated Vital Signs BP (!) 153/96 (BP Location: Left Arm)   Pulse 76   Temp 98.3 F (36.8 C)   Resp 18   Ht 6' (1.829 m)   Wt 86.2 kg   SpO2 100%   BMI 25.77 kg/m  Physical Exam Vitals and nursing note reviewed.  Constitutional:      Appearance: He is well-developed.  HENT:     Head: Normocephalic and atraumatic.  Eyes:     Conjunctiva/sclera: Conjunctivae normal.  Pulmonary:     Effort: No respiratory distress.  Musculoskeletal:      Cervical back: Normal range of motion and neck supple.  Skin:    General: Skin is warm and dry.     Comments: 3 to 4 cm area of induration with overlying erythema in the right axilla consistent with abscess or inflamed sebaceous cyst.  Neurological:     Mental Status: He is alert.     ED Results / Procedures / Treatments   Labs (all labs ordered are listed, but only abnormal results are displayed) Labs Reviewed - No data to display  EKG None  Radiology No results found.  Procedures .Marland KitchenIncision and Drainage  Date/Time: 05/04/2023 4:37 PM  Performed by: Renne Crigler, PA-C Authorized by: Renne Crigler, PA-C   Consent:    Consent obtained:  Verbal   Consent given by:  Patient   Risks discussed:  Bleeding, pain and incomplete drainage Universal protocol:    Patient identity confirmed:  Verbally with patient and provided demographic data Location:    Type:  Abscess   Size:  3cm   Location: R axilla. Pre-procedure details:    Skin preparation:  Povidone-iodine Sedation:    Sedation type:  None Anesthesia:    Anesthesia method:  Local infiltration   Local anesthetic:  Lidocaine  2% WITH epi Procedure type:    Complexity:  Complex Procedure details:    Incision types:  Stab incision   Wound management:  Probed and deloculated   Drainage:  Purulent   Drainage amount:  Moderate   Wound treatment:  Wound left open Post-procedure details:    Procedure completion:  Tolerated well, no immediate complications     Medications Ordered in ED Medications - No data to display  ED Course/ Medical Decision Making/ A&P    Patient seen and examined. History obtained directly from patient.  Bedside ultrasound shows a well-circumscribed area of likely fluid that can be drained.  Discussed risks and benefits of incision and drainage procedure with patient and he agrees to proceed.  Labs/EKG: None ordered  Imaging: None ordered  Medications/Fluids: Ordered: Lidocaine 2% with  epinephrine  Most recent vital signs reviewed and are as follows: BP (!) 153/96 (BP Location: Left Arm)   Pulse 76   Temp 98.3 F (36.8 C)   Resp 18   Ht 6' (1.829 m)   Wt 86.2 kg   SpO2 100%   BMI 25.77 kg/m   Initial impression: Axillary abscess, right-sided.  4:38 PM Reassessment performed. Patient appears stable.  He tolerated the I&D procedure well.    Reviewed pertinent lab work and imaging with patient at bedside. Questions answered.   Most current vital signs reviewed and are as follows: BP (!) 153/96 (BP Location: Left Arm)   Pulse 76   Temp 98.3 F (36.8 C)   Resp 18   Ht 6' (1.829 m)   Wt 86.2 kg   SpO2 100%   BMI 25.77 kg/m   Plan: Discharge to home.   Prescriptions written for: Doxycycline x 5 days (additional)  The patient was urged to return to the Emergency Department urgently with worsening pain, swelling, expanding erythema especially if it streaks away from the affected area, fever, or if they have any other concerns.   The patient was urged to return to the Emergency Department or go to their PCP in 48 hours for wound recheck if the area is not significantly improved.  The patient verbalized understanding and stated agreement with this plan.                                    Medical Decision Making Risk Prescription drug management.   Patient with infected sebaceous cyst of the right axilla with mild associated cellulitis.  I&D performed without complication.  No fevers or signs of sepsis.  Patient has nearly completed a course of doxycycline.  I have written for an additional 5 days.  Hopefully symptoms will begin to resolve now that the cyst has been opened up.        Final Clinical Impression(s) / ED Diagnoses Final diagnoses:  Inflamed sebaceous cyst    Rx / DC Orders ED Discharge Orders          Ordered    doxycycline (VIBRAMYCIN) 100 MG capsule  2 times daily        05/04/23 1635              Renne Crigler,  PA-C 05/04/23 1640    Sloan Leiter, DO 05/04/23 2346

## 2023-05-04 NOTE — ED Notes (Signed)
Pt given discharge instructions and reviewed prescriptions. Opportunities given for questions. Pt verbalizes understanding. Fares Ramthun R, RN 

## 2023-05-04 NOTE — Discharge Instructions (Signed)
Please read and follow all provided instructions.  Your diagnoses today include:  1. Inflamed sebaceous cyst     Tests performed today include: Vital signs. See below for your results today.   Medications prescribed:  Doxycycline - antibiotic  You have been prescribed an antibiotic medicine: take the entire course of medicine even if you are feeling better. Stopping early can cause the antibiotic not to work.  Take any prescribed medications only as directed.   Home care instructions:  Follow any educational materials contained in this packet  Follow-up instructions: Return to the Emergency Department in 48 hours for a recheck if your symptoms are not significantly improved.  Please follow-up with your primary care provider in the next 1 week for further evaluation of your symptoms.   Return instructions:  Return to the Emergency Department if you have: Fever Worsening symptoms Worsening pain Worsening swelling Redness of the skin that moves away from the affected area, especially if it streaks away from the affected area  Any other emergent concerns  Your vital signs today were: BP (!) 153/96 (BP Location: Left Arm)   Pulse 76   Temp 98.3 F (36.8 C)   Resp 18   Ht 6' (1.829 m)   Wt 86.2 kg   SpO2 100%   BMI 25.77 kg/m  If your blood pressure (BP) was elevated above 135/85 this visit, please have this repeated by your doctor within one month. --------------

## 2023-05-16 ENCOUNTER — Other Ambulatory Visit (HOSPITAL_BASED_OUTPATIENT_CLINIC_OR_DEPARTMENT_OTHER): Payer: Self-pay

## 2023-07-07 ENCOUNTER — Other Ambulatory Visit: Payer: Self-pay | Admitting: Neurology

## 2023-07-07 NOTE — Telephone Encounter (Signed)
Sent to dr Karel Jarvis to send in [facility]

## 2023-07-07 NOTE — Telephone Encounter (Signed)
Patient called neeiding refilled on CloBazam 20mg  and Fluoxetine 20mg  please send to [facility] on [address]

## 2023-07-08 MED ORDER — FLUOXETINE HCL 20 MG PO CAPS: 20.0000 mg | ORAL_CAPSULE | Freq: Every day | ORAL | 0 refills | Status: DC

## 2023-07-08 MED ORDER — CLOBAZAM 20 MG PO TABS: ORAL_TABLET | ORAL | 0 refills | Status: DC

## 2023-07-08 MED ORDER — DIVALPROEX SODIUM 500 MG PO DR TAB: 500.0000 mg | DELAYED_RELEASE_TABLET | Freq: Two times a day (BID) | ORAL | 0 refills | Status: DC

## 2023-08-06 ENCOUNTER — Other Ambulatory Visit: Payer: Self-pay | Admitting: Neurology

## 2023-08-15 ENCOUNTER — Ambulatory Visit (INDEPENDENT_AMBULATORY_CARE_PROVIDER_SITE_OTHER): Payer: Medicaid Other | Admitting: Neurology

## 2023-08-15 ENCOUNTER — Encounter: Payer: Self-pay | Admitting: Neurology

## 2023-08-15 VITALS — BP 110/65 | HR 52 | Ht 72.0 in | Wt 194.0 lb

## 2023-08-15 DIAGNOSIS — G40309 Generalized idiopathic epilepsy and epileptic syndromes, not intractable, without status epilepticus: Secondary | ICD-10-CM | POA: Diagnosis not present

## 2023-08-15 DIAGNOSIS — R400 Somnolence: Secondary | ICD-10-CM | POA: Diagnosis not present

## 2023-08-15 MED ORDER — CLOBAZAM 20 MG PO TABS
30.0000 mg | ORAL_TABLET | Freq: Every evening | ORAL | 4 refills | Status: DC
  Filled 2023-09-25: qty 45, 30d supply, fill #0
  Filled 2023-09-25: qty 135, 34d supply, fill #0
  Filled 2023-09-25: qty 45, 30d supply, fill #0
  Filled 2023-10-22: qty 45, 30d supply, fill #1
  Filled 2023-11-17 (×4): qty 45, 30d supply, fill #2
  Filled 2023-12-19: qty 45, 30d supply, fill #3
  Filled 2024-01-17 (×2): qty 45, 30d supply, fill #4
  Filled ????-??-??: fill #2

## 2023-08-15 MED ORDER — DIVALPROEX SODIUM 500 MG PO DR TAB
500.0000 mg | DELAYED_RELEASE_TABLET | Freq: Two times a day (BID) | ORAL | 4 refills | Status: DC
  Filled 2023-09-25 (×2): qty 180, 90d supply, fill #0

## 2023-08-15 NOTE — Patient Instructions (Addendum)
Good to see you.  Schedule home sleep test  2. Continue Clobazam 20mg : take 1 and 1/2 tablets every night, Depakote 500mg  twice a day  3. Follow-up in 1 year, call for any changes   Seizure Precautions: 1. If medication has been prescribed for you to prevent seizures, take it exactly as directed.  Do not stop taking the medicine without talking to your doctor first, even if you have not had a seizure in a long time.   2. Avoid activities in which a seizure would cause danger to yourself or to others.  Don't operate dangerous machinery, swim alone, or climb in high or dangerous places, such as on ladders, roofs, or girders.  Do not drive unless your doctor says you may.  3. If you have any warning that you may have a seizure, lay down in a safe place where you can't hurt yourself.    4.  No driving for 6 months from last seizure, as per [facility].   Please refer to the following link on the Epilepsy Foundation of America's website for more information: http://www.epilepsyfoundation.org/answerplace/Social/driving/drivingu.cfm   5.  Maintain good sleep hygiene. Avoid alcohol.  6.  Contact your doctor if you have any problems that may be related to the medicine you are taking.  7.  Call 911 and bring the patient back to the ED if:        A.  The seizure lasts longer than 5 minutes.       B.  The patient doesn't awaken shortly after the seizure  C.  The patient has new problems such as difficulty seeing, speaking or moving  D.  The patient was injured during the seizure  E.  The patient has a temperature over 102 F (39C)  F.  The patient vomited and now is having trouble breathing

## 2023-08-15 NOTE — Progress Notes (Signed)
NEUROLOGY FOLLOW UP OFFICE NOTE  Legrand Wannamaker [id number] [birth year]  HISTORY OF PRESENT ILLNESS: I had the pleasure of seeing Bennette Manigault in follow-up in the neurology clinic on 08/15/2023.  The patient was last seen a year ago for seizures. He is again accompanied by his significant other [name] who helps supplement the history today. Records and images were personally reviewed where available.  Since his last visit, he denies any seizures or seizure-like symptoms since 06/2021. He denies any staring/unresponsive episodes, gaps in time, olfactory/gustatory hallucinations, focal numbness/tingling/weakness, myoclonic jerks. No headaches, dizziness, vision changes, no falls. He was reporting daytime drowsiness and fatigue on last visit, Clobazam reduced to 30mg  at bedtime (20mg  1.5 tabs at bedtime). He is also on Depakote 500mg  BID. No significant side effects. No change in daytime drowsiness, he needs 10 hours of sleep or he feels bad. He usually gets 8 hours of interrupted sleep. No snoring. Sertraline helped a lot with mood.    History on Initial Assessment 04/04/2022: This is a 22 year old right-handed man with a history of depression, presenting to establish care for epilepsy. He is accompanied by his girlfriend [name] who helps supplement the history. Records from [facility] were reviewed and will be summarized below. Seizures started at age 13. He has a very brief 3-5 second warning where it feels like something is behind his head then he would lose consciousness and witnesses report convulsive activity. 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 He recalls taking Keppra and Phenobarbital at one point. He reports that he was on Keppra and Onfi, then at age [age] he was switched from Keppra to Depakote due to Keppra rage. He is on Depakote 500mg  BID and Onfi 20mg  BID. They note that the convulsions significant reduced in intensity with Depakote, [name] notes he would have brief  body jerking. He had 2 seizures in 2022, one on 03/29/21 in the setting of alcohol use, then the last one was around 06/2021 which was a nocturnal seizure triggered by sleep deprivation. After a seizure, he feels a weird feeling in his head, feeling spaced out and dizzy the whole day, just wanting to stay in bed. He and his girlfriend deny any staring/unresponsive episodes, gaps in time, olfactory/gustatory hallucinations. He has myoclonic jerks when he is sleep deprived. He tries hard to get 8 hours of sleep because he know that if he gets less, he would tend to have a seizure.   He denies any headaches, dizziness, diplopia, dysarthria/dysphagia, neck/back pain, bowel/bladder dysfunction. Mood is okay, [name] reports he is moody sometimes. Memory is "terrible." He notes he is always sleepy and tired despite getting 8 hours of sleep. He snores when lying on his back. He lives with Anne Fu and his mother and brother. He is driving. He hopes to work for [employer] part-time.   Epilepsy Risk Factors:  Strong family history of seizures in his mother (started at age 23, off VPA since age 24), mother's uncle, maternal aunt and cousin. He had head injuries from football (concussion in the 23th grade). He had a normal birth and early development.  There is no history of febrile convulsions, CNS infections such as meningitis/encephalitis, significant traumatic brain injury, neurosurgical procedures.   Prior ASMs: Keppra ("Keppra rage"), Phenobarbital  Diagnostic Data: EEGs: Routine EEG in 02/2017 showed interictal generalized but frontally predominant spike and wave discharges. In association with photic stimulation at frequencies from 9-19 Hz a photoparoxymal response was often noted which consisted of brief bursts of  generalized spike and wave discharges. Only on one occasion at 19 Hz does the discharge outlast the photic stimulation. With hyperventilation, there was a burst of irregularly occurring fairly generalized spike  and wave discharges with a frequency of 2-3 Hz lasting 3-4 seconds, no grossly evident behavioral correlate but no testing done.  2-day EMU evaluation at [facility] in April 2021 showed interictal discharges consistent with a primary generalized epilepsy. Push button events of aura and diffuse shaking had no EEG correlate. There was nonrhythmic shaking/jerking, tearfulness. They have not happened since April 2021   MRI of brain done 10/08/19 reported subcentimeter left anterior horn/periventricular cystic lesions are unchanged from 2019 imaging.   PAST MEDICAL HISTORY: Past Medical History:  Diagnosis Date   Anxiety    Concussion    Depression    Epilepsy (HCC)    Obsessive-compulsive disorder    Sleep disorder    Vomiting     MEDICATIONS: Current Outpatient Medications on File Prior to Visit  Medication Sig Dispense Refill   cloBAZam (ONFI) 20 MG tablet Take 1 and 1/2 tablets every night 45 tablet 0   divalproex (DEPAKOTE) 500 MG DR tablet Take 1 tablet by mouth twice daily 60 tablet 0   sertraline (ZOLOFT) 50 MG tablet Take 50 mg by mouth daily.     No current facility-administered medications on file prior to visit.    ALLERGIES: No Known Allergies  FAMILY HISTORY: Family History  Problem Relation Age of Onset   Lung cancer Paternal Grandmother        Died at 5    SOCIAL HISTORY: Social History   Socioeconomic History   Marital status: Single    Spouse name: Not on file   Number of children: Not on file   Years of education: Not on file   Highest education level: Not on file  Occupational History   Not on file  Tobacco Use   Smoking status: Every Day   Smokeless tobacco: Never  Vaping Use   Vaping status: Some Days  Substance and Sexual Activity   Alcohol use: Yes   Drug use: Yes    Types: Marijuana   Sexual activity: Yes    Partners: Female    Birth control/protection: Condom, Other-see comments    Comment: does not use protection every time  Other Topics  Concern   Not on file  Social History Narrative   Ilyaas is currently working on his GED-does not attend high school.   He lives with his parents and 3 siblings.   Right handed   Drinks caffeine   One story home   Social Determinants of Health   Financial Resource Strain: Not on file  Food Insecurity: Not on file  Transportation Needs: Not on file  Physical Activity: Not on file  Stress: Not on file  Social Connections: Not on file  Intimate Partner Violence: Not on file     PHYSICAL EXAM: Vitals:   08/15/23 0817  BP: 110/65  Pulse: (!) 52  SpO2: 98%   General: No acute distress Head:  Normocephalic/atraumatic Skin/Extremities: No rash, no edema Neurological Exam: alert and awake. No aphasia or dysarthria. Fund of knowledge is appropriate. Attention and concentration are normal.   Cranial nerves: Pupils equal, round. Extraocular movements intact with no nystagmus. Visual fields full.  No facial asymmetry.  Motor: Bulk and tone normal, muscle strength 5/5 throughout with no pronator drift.   Finger to nose testing intact.  Gait narrow-based and steady, able to tandem walk adequately.  Romberg negative. No tremors.   IMPRESSION: This is a 22 yo RH man with a history of depression with Primary Generalized Epilepsy. He continues to do well seizure-free since 06/2021 on Depakote 500mg  BID and Clobazam 20mg  1.5 tabs at bedtime (30mg  at bedtime). He continues to report daytime drowsiness despite reduction in dose, home sleep study will be ordered to assess for OSA. They felt Depakote was more helpful with seizure control, we may consider further reducing Clobazam on next visit/after sleep study. He is aware of [place] driving laws to stop driving after a seizure until 6 months seizure-free. Follow-up in 1 year, call for any changes.    Thank you for allowing me to participate in his care.  Please do not hesitate to call for any questions or concerns.    Patrcia Dolly, M.D.   CC: Dr.  Jeannetta Nap

## 2023-09-25 ENCOUNTER — Other Ambulatory Visit (HOSPITAL_COMMUNITY): Payer: Self-pay

## 2023-09-26 ENCOUNTER — Other Ambulatory Visit (HOSPITAL_COMMUNITY): Payer: Self-pay

## 2023-10-12 ENCOUNTER — Encounter: Payer: Self-pay | Admitting: Neurology

## 2023-10-23 ENCOUNTER — Other Ambulatory Visit: Payer: Self-pay

## 2023-11-11 LAB — LAB REPORT - SCANNED
EGFR (Non-African Amer.): 114
TSH: 2.34 (ref 0.41–5.90)

## 2023-11-17 ENCOUNTER — Other Ambulatory Visit (HOSPITAL_COMMUNITY): Payer: Self-pay

## 2023-11-28 ENCOUNTER — Ambulatory Visit (HOSPITAL_BASED_OUTPATIENT_CLINIC_OR_DEPARTMENT_OTHER): Payer: Medicaid Other | Attending: Neurology | Admitting: Internal Medicine

## 2023-11-28 DIAGNOSIS — F515 Nightmare disorder: Secondary | ICD-10-CM | POA: Insufficient documentation

## 2023-11-28 DIAGNOSIS — R400 Somnolence: Secondary | ICD-10-CM | POA: Diagnosis present

## 2023-11-28 DIAGNOSIS — G4731 Primary central sleep apnea: Secondary | ICD-10-CM | POA: Diagnosis not present

## 2023-11-30 ENCOUNTER — Other Ambulatory Visit: Payer: Self-pay | Admitting: Neurology

## 2023-12-02 DIAGNOSIS — R400 Somnolence: Secondary | ICD-10-CM | POA: Diagnosis not present

## 2023-12-02 NOTE — Procedures (Signed)
    [facility] [address] [place], [place] [address] Tel: [phone]   Fax: [phone]  Home Sleep Test Interpretation  Patient Name:  Albert Gomez, Albert Gomez Date:  11/28/2023 Referring Physician:  Patrcia Dolly, MD  Indications for Polysomnography The patient is a 23 year-old Male who is 6' and weighs 194.0 lbs. His BMI equals 26.6.  A home sleep apnea test was performed to evaluate for Nightmares.  Polysomnogram Data A home sleep test recorded the standard physiologic parameters including EKG, nasal and oral airflow.  Respiratory parameters of chest and abdominal movements were recorded with Respiratory Inductance Plethysmography belts.  Oxygen saturation was recorded by pulse oximetry.   Study Architecture The total recording time of the polysomnogram was 360.0 minutes.  The total monitoring time was 360.5 minutes.  Time spent in Supine position was - minutes.   Respiratory Events The study revealed a presence of - obstructive, 9 central, and - mixed apneas resulting in an Apnea index of 1.5 events per hour.  There were 26 hypopneas (>=3% desaturation and/or arousal) resulting in an Apnea\Hypopnea Index (AHI >=3% desaturation and/or arousal) of 5.8 events per hour.  There were 7 hypopneas (>=4% desaturation) resulting in an Apnea\Hypopnea Index (AHI >=4% desaturation) of 2.7 events per hour.  There were - Respiratory Effort Related Arousals resulting in a RERA index of - events per hour. The Respiratory Disturbance Index is 5.8 events per hour.  The snore index was 5.8 events per hour.  Mean oxygen saturation was 96.2%.  The lowest oxygen saturation during monitoring time was 60.0%.  Time spent <=88% oxygen saturation was 1.2 minutes (0.3%).  Cardiac Summary The average pulse rate was 59.5 bpm.  The minimum pulse rate was 44.0 bpm while the maximum pulse rate was 97.0 bpm.  Cardiac rhythm was normal/abnormal.  Comment: Occasional Central apneas  and hypopneas. AHI (3%) 5.8/hr. Oxygen desaturation to a nadir of 60%, mean 96.2%.  This was a home sleep test, without limb lead, EEG or technician observation. Technician noted that when patient came to [facility] to pick up the monitoring device, he commented that he does have 2 medications he is supposed to take at night; one was for sleep. He reported he discontinued taking them for the last few weeks because one didn't seem to work and the other made him feel bad. He could not remember the medication names. He also reported having "nightmares every night" and physically acting out the dreams. His girlfriend was with him and confirmed witnessing this. She reported he moves a lot, talks and gets upset in his sleep. It is unclear if these events relate to intervals of oxygen desaturation, which were brief on the current study.   Diagnosis: Mild Central sleep apnea  Recommendations: Consider in-center polysomnogram or sleep-EEG if appropriate.    This study was personally reviewed and electronically signed by: Jetty Duhamel, MD Accredited Board Certified in Sleep Medicine Date/Time: 12/02/23  1:50 PM                         Jetty Duhamel Diplomate, American Board of Sleep Medicine  ELECTRONICALLY SIGNED ON:  12/02/2023, 1:44 PM  [facility] PH: [phone]   FX: [phone] ACCREDITED BY THE AMERICAN ACADEMY OF SLEEP MEDICINE

## 2023-12-04 ENCOUNTER — Telehealth: Payer: Self-pay

## 2023-12-04 DIAGNOSIS — R400 Somnolence: Secondary | ICD-10-CM

## 2023-12-04 NOTE — Telephone Encounter (Signed)
-----   Message from Van Clines sent at 12/04/2023 12:17 PM EDT ----- Regarding: sleep study Pls let patient know the physician who read his sleep study recommends he do an in-lab sleep study to further evaluate his symptoms. Pls order in-lab sleep study (on order, as recommended by Dr. Maple Hudson; diagnosis: central sleep apnea)  Thanks! ----- Message ----- From: Waymon Budge, MD Sent: 12/02/2023   2:03 PM EDT To: Van Clines, MD

## 2023-12-04 NOTE — Telephone Encounter (Signed)
 Pt called an informed the physician who read his sleep study recommends he do an in-lab sleep study to further evaluate his symptoms. Order placed for in lab sleep study

## 2023-12-20 ENCOUNTER — Other Ambulatory Visit: Payer: Self-pay

## 2023-12-24 ENCOUNTER — Encounter (HOSPITAL_COMMUNITY): Payer: Self-pay | Admitting: Radiology

## 2023-12-24 ENCOUNTER — Emergency Department (HOSPITAL_COMMUNITY)

## 2023-12-24 ENCOUNTER — Emergency Department (HOSPITAL_COMMUNITY)
Admission: EM | Admit: 2023-12-24 | Discharge: 2023-12-24 | Disposition: A | Discharge status: Home / Self Care | Attending: Emergency Medicine | Admitting: Emergency Medicine

## 2023-12-24 ENCOUNTER — Other Ambulatory Visit: Payer: Self-pay

## 2023-12-24 DIAGNOSIS — R569 Unspecified convulsions: Secondary | ICD-10-CM | POA: Diagnosis present

## 2023-12-24 DIAGNOSIS — F10929 Alcohol use, unspecified with intoxication, unspecified: Secondary | ICD-10-CM | POA: Diagnosis not present

## 2023-12-24 DIAGNOSIS — Y906 Blood alcohol level of 120-199 mg/100 ml: Secondary | ICD-10-CM | POA: Insufficient documentation

## 2023-12-24 LAB — CBC WITH DIFFERENTIAL/PLATELET
Abs Immature Granulocytes: 0.05 10*3/uL (ref 0.00–0.07)
Basophils Absolute: 0 10*3/uL (ref 0.0–0.1)
Basophils Relative: 0 %
Eosinophils Absolute: 0 10*3/uL (ref 0.0–0.5)
Eosinophils Relative: 0 %
HCT: 44.5 % (ref 39.0–52.0)
Hemoglobin: 15.9 g/dL (ref 13.0–17.0)
Immature Granulocytes: 0 %
Lymphocytes Relative: 12 %
Lymphs Abs: 1.5 10*3/uL (ref 0.7–4.0)
MCH: 32.2 pg (ref 26.0–34.0)
MCHC: 35.7 g/dL (ref 30.0–36.0)
MCV: 90.1 fL (ref 80.0–100.0)
Monocytes Absolute: 0.8 10*3/uL (ref 0.1–1.0)
Monocytes Relative: 6 %
Neutro Abs: 10.3 10*3/uL — ABNORMAL HIGH (ref 1.7–7.7)
Neutrophils Relative %: 82 %
Platelets: 217 10*3/uL (ref 150–400)
RBC: 4.94 MIL/uL (ref 4.22–5.81)
RDW: 11.7 % (ref 11.5–15.5)
WBC: 12.7 10*3/uL — ABNORMAL HIGH (ref 4.0–10.5)
nRBC: 0 % (ref 0.0–0.2)

## 2023-12-24 LAB — COMPREHENSIVE METABOLIC PANEL WITH GFR
ALT: 38 U/L (ref 0–44)
AST: 50 U/L — ABNORMAL HIGH (ref 15–41)
Albumin: 4.7 g/dL (ref 3.5–5.0)
Alkaline Phosphatase: 57 U/L (ref 38–126)
Anion gap: 13 (ref 5–15)
BUN: 11 mg/dL (ref 6–20)
CO2: 29 mmol/L (ref 22–32)
Calcium: 9.1 mg/dL (ref 8.9–10.3)
Chloride: 100 mmol/L (ref 98–111)
Creatinine, Ser: 0.97 mg/dL (ref 0.61–1.24)
GFR, Estimated: >60 mL/min (ref >60)
Glucose, Bld: 99 mg/dL (ref 70–99)
Potassium: 3.5 mmol/L (ref 3.5–5.1)
Sodium: 142 mmol/L (ref 135–145)
Total Bilirubin: 0.5 mg/dL (ref 0.0–1.2)
Total Protein: 7.2 g/dL (ref 6.5–8.1)

## 2023-12-24 LAB — RAPID URINE DRUG SCREEN, HOSP PERFORMED
Amphetamines: NOT DETECTED
Barbiturates: NOT DETECTED
Benzodiazepines: POSITIVE — AB
Cocaine: NOT DETECTED
Opiates: NOT DETECTED
Tetrahydrocannabinol: NOT DETECTED

## 2023-12-24 LAB — CBG MONITORING, ED
Glucose-Capillary: 59 mg/dL — ABNORMAL LOW (ref 70–99)
Glucose-Capillary: 95 mg/dL (ref 70–99)

## 2023-12-24 LAB — MAGNESIUM: Magnesium: 2.1 mg/dL (ref 1.7–2.4)

## 2023-12-24 LAB — VALPROIC ACID LEVEL: Valproic Acid Lvl: 73 ug/mL (ref 50.0–100.0)

## 2023-12-24 LAB — ETHANOL: Alcohol, Ethyl (B): 156 mg/dL — ABNORMAL HIGH (ref <10)

## 2023-12-24 MED ORDER — METOCLOPRAMIDE HCL 5 MG/ML IJ SOLN
10.0000 mg | Freq: Once | INTRAMUSCULAR | Status: AC
  Administered 2023-12-24: 10 mg via INTRAVENOUS
  Filled 2023-12-24: qty 2

## 2023-12-24 MED ORDER — KETOROLAC TROMETHAMINE 15 MG/ML IJ SOLN
15.0000 mg | Freq: Once | INTRAMUSCULAR | Status: AC
  Administered 2023-12-24: 15 mg via INTRAVENOUS
  Filled 2023-12-24: qty 1

## 2023-12-24 MED ORDER — ACETAMINOPHEN 325 MG PO TABS
650.0000 mg | ORAL_TABLET | Freq: Once | ORAL | Status: AC
  Administered 2023-12-24: 650 mg via ORAL
  Filled 2023-12-24: qty 2

## 2023-12-24 MED ORDER — SODIUM CHLORIDE 0.9 % IV BOLUS
1000.0000 mL | Freq: Once | INTRAVENOUS | Status: AC
  Administered 2023-12-24: 1000 mL via INTRAVENOUS

## 2023-12-24 NOTE — ED Notes (Signed)
 CBG 59 [id number], not crossing over from glucometer, RN Vernona Rieger notified

## 2023-12-24 NOTE — ED Notes (Signed)
 Pt verbalized understanding of discharge instructions. Pt ambulatory at time of discharge.

## 2023-12-24 NOTE — ED Notes (Signed)
 Pt heart rate 153. This nurse goes to room and finds patient with fist clinched and eyes closed as if very angry. Pt girlfriend was leaning over him and this nurse tapped her and told her to back up. PT opened his eyes and states, "oh, I didn't know you were standing there". When asked what he was doing, the patient states, "I'm making my heart rate and blood pressure go up, its cool isnt it". I made patient aware that he could trigger another seizure or worse cause himself to be in a sustained VT. At this point patients heart rate is 156. Once patient stopped his behavior his heart rate returned to 115. Pt believed it was funny that people got concerned about his heart rate being elevated.

## 2023-12-24 NOTE — ED Notes (Signed)
 Pt BS 59. Sandwich and soda provided. Pt sitting up in bed eating.

## 2023-12-24 NOTE — ED Triage Notes (Signed)
 Pt in today with seizures after drinking at a party tonight. Pt is compliant with meds but drank tonight which set his seizures off. Pt had multiple according to family and EMS. Pt ran into a field of bamboo so patient has multiple scratches to his body.   Versed 7.5mg  given in route. 131/93 115 100% RA CBG 81

## 2023-12-24 NOTE — ED Provider Notes (Signed)
 [facility]GENC[facility] AT [facility] Provider Note   CSN: [id number] Arrival date & time: 12/24/23  0130     History  Chief Complaint  Patient presents with   Seizures    Albert Gomez is a 23 y.o. male.  The history is provided by the patient, the EMS personnel and a significant other.  Seizures Albert Gomez is a 23 y.o. male who presents to the Emergency Department complaining of seizures.  He presents to the emergency department via EMS for evaluation of recurrent seizures.  History is provided by EMS, the patient and his girlfriend.  He has a history of seizure disorder and is compliant with his medications, which include Depakote and clobazam.  Tonight he went out drinking at a restaurant and had multiple shots.  When they were going home he did have an episode of emesis on the way home and he became slightly agitated.  He ran off into the bamboo outside the home and then had what his girlfriend describes as multiple generalized seizures.  Between her account and EMS account he had between 5 and 11.  These are described as both generalized as well as focal with episodes of agitation in between.  They report that at times he looks to the right and shakes 1 hand in the air.  He does not typically drink alcohol.  No reported recent illnesses.  He did hit his head multiple times on the ground and on bamboo.  Patient has no recollection of the events.  He did receive 5 mg IM of midazolam as well as 2.5 mg of IV midazolam.     Home Medications Prior to Admission medications   Medication Sig Start Date End Date Taking? Authorizing Provider  cloBAZam (ONFI) 20 MG tablet Take 1.5 tablets (30 mg total) by mouth at night. 08/15/23   Van Clines, MD  divalproex (DEPAKOTE) 500 MG DR tablet Take 1 tablet by mouth twice daily 11/30/23   Van Clines, MD  sertraline (ZOLOFT) 50 MG tablet Take 50 mg by mouth daily. 08/11/23   [provider]       Allergies    Patient has no known allergies.    Review of Systems   Review of Systems  Neurological:  Positive for seizures.  All other systems reviewed and are negative.   Physical Exam Updated Vital Signs BP 114/73 (BP Location: Right Arm)   Pulse 60   Temp 97.8 F (36.6 C) (Oral)   Resp 15   Ht 6' (1.829 m)   Wt 79.4 kg   SpO2 100%   BMI 23.73 kg/m  Physical Exam Vitals and nursing note reviewed.  Constitutional:      Appearance: He is well-developed.  HENT:     Head: Normocephalic.     Comments: Mid forehead contusion. Eyes:     Extraocular Movements: Extraocular movements intact.     Pupils: Pupils are equal, round, and reactive to light.  Cardiovascular:     Rate and Rhythm: Regular rhythm. Tachycardia present.     Heart sounds: No murmur heard. Pulmonary:     Effort: Pulmonary effort is normal. No respiratory distress.     Breath sounds: Normal breath sounds.  Abdominal:     Palpations: Abdomen is soft.     Tenderness: There is no abdominal tenderness. There is no guarding or rebound.  Musculoskeletal:        General: No tenderness.  Skin:    General: Skin is  warm and dry.  Neurological:     Mental Status: He is alert and oriented to person, place, and time.     Comments: Mildly confused but oriented x 3.  No asymmetry of facial movements.  5 out of 5 strength in all 4 extremities     ED Results / Procedures / Treatments   Labs (all labs ordered are listed, but only abnormal results are displayed) Labs Reviewed  COMPREHENSIVE METABOLIC PANEL WITH GFR - Abnormal; Notable for the following components:      Result Value   AST 50 (*)    All other components within normal limits  CBC WITH DIFFERENTIAL/PLATELET - Abnormal; Notable for the following components:   WBC 12.7 (*)    Neutro Abs 10.3 (*)    All other components within normal limits  ETHANOL - Abnormal; Notable for the following components:   Alcohol, Ethyl (B) 156 (*)    All other  components within normal limits  RAPID URINE DRUG SCREEN, HOSP PERFORMED - Abnormal; Notable for the following components:   Benzodiazepines POSITIVE (*)    All other components within normal limits  CBG MONITORING, ED - Abnormal; Notable for the following components:   Glucose-Capillary 59 (*)    All other components within normal limits  MAGNESIUM  VALPROIC ACID LEVEL  CBG MONITORING, ED    EKG EKG Interpretation Date/Time:  Sunday December 24 2023 01:37:50 EDT Ventricular Rate:  127 PR Interval:  123 QRS Duration:  87 QT Interval:  306 QTC Calculation: 445 R Axis:   97  Text Interpretation: Sinus tachycardia Borderline right axis deviation Confirmed by Tilden Fossa [phone]) on 12/24/2023 2:00:22 AM  Radiology DG Chest 2 View Result Date: 12/24/2023 CLINICAL DATA:  Seizure, rib pain EXAM: CHEST - 2 VIEW COMPARISON:  None Available. FINDINGS: The heart size and mediastinal contours are within normal limits. Both lungs are clear. The visualized skeletal structures are unremarkable. IMPRESSION: No active cardiopulmonary disease. Electronically Signed   By: Helyn Numbers M.D.   On: 12/24/2023 04:52   CT Head Wo Contrast Result Date: 12/24/2023 CLINICAL DATA:  Head trauma, moderate-severe head trauma, recurrent seizures EXAM: CT HEAD WITHOUT CONTRAST TECHNIQUE: Contiguous axial images were obtained from the base of the skull through the vertex without intravenous contrast. RADIATION DOSE REDUCTION: This exam was performed according to the departmental dose-optimization program which includes automated exposure control, adjustment of the mA and/or kV according to patient size and/or use of iterative reconstruction technique. COMPARISON:  CT head 03/30/2020. FINDINGS: Brain: No evidence of acute infarction, hemorrhage, hydrocephalus, extra-axial collection or mass lesion/mass effect. Small left periventricular cyst appears similar to priors and was better characterized on 2018 MRI. Vascular:  No hyperdense vessel. Skull: No acute fracture. Sinuses/Orbits: Clear sinuses.  No acute orbital findings. IMPRESSION: No evidence of acute intracranial abnormality. Electronically Signed   By: Feliberto Harts M.D.   On: 12/24/2023 03:13    Procedures Procedures    Medications Ordered in ED Medications  sodium chloride 0.9 % bolus 1,000 mL (0 mLs Intravenous Stopped 12/24/23 0253)  metoCLOPramide (REGLAN) injection 10 mg (10 mg Intravenous Given 12/24/23 0252)  acetaminophen (TYLENOL) tablet 650 mg (650 mg Oral Given 12/24/23 0253)  ketorolac (TORADOL) 15 MG/ML injection 15 mg (15 mg Intravenous Given 12/24/23 0355)    ED Course/ Medical Decision Making/ A&P  Medical Decision Making Amount and/or Complexity of Data Reviewed Labs: ordered. Radiology: ordered.  Risk OTC drugs. Prescription drug management.   Patient with history of seizure disorder here for evaluation after drinking alcohol and having seizure-like activity prior to ED arrival.  During his ED stay he did have spells where he would stare into space and his heart rate would get elevated, but he could be told to stop and he would stop these episodes.  There was no seizure activity seen during his ED stay.  Alcohol level is elevated in the emergency department.  CT head is negative for acute abnormality.  CBC with mild leukocytosis, no evidence of acute infection.  He did have transient hypoglycemia, this did improve after eating.  Patient was observed for several hours and found to be clinically sober on reassessment.  He is able to walk without difficulty and does not have focal complaints.  No evidence of additional injuries secondary to his seizure-like activity.  Feel he is stable for discharge home with outpatient neurology follow-up and return precautions.  Discussed avoiding alcohol and also discussed that he cannot drive for at least 6 months.  Seizure precautions  discussed.        Final Clinical Impression(s) / ED Diagnoses Final diagnoses:  Alcoholic intoxication with complication (HCC)  Seizure-like activity [facility])    Rx / DC Orders ED Discharge Orders     None         Tilden Fossa, MD 12/24/23 [phone]

## 2023-12-25 ENCOUNTER — Telehealth: Payer: Self-pay | Admitting: Neurology

## 2023-12-25 NOTE — Telephone Encounter (Signed)
 Called pt mother no answer unable to leave voice mail

## 2023-12-25 NOTE — Telephone Encounter (Signed)
 Caller stated patient had 10 back to back seizures Sunday morning. Stated they were very aggressive and scary, mother thought son was dead. Was told to contact Dr Karel Jarvis asap.

## 2023-12-26 ENCOUNTER — Other Ambulatory Visit (HOSPITAL_COMMUNITY): Payer: Self-pay

## 2023-12-26 NOTE — Telephone Encounter (Signed)
 Pt's friend called in returning Heather's call. She says Kasch is with her and can talk to [facility] when she calls back.

## 2023-12-26 NOTE — Telephone Encounter (Signed)
 Pt called no answer left a voice mail to call the office back, tried moms phone no answer unable to leave a voice mail,  Pt c/o: seizure Missed medications?   Sleep deprived?   Alcohol intake?   Increased stress?  Did you have any triggers?     If yes what? Any change in medication color or shape?  Back to their usual baseline self?  . If no, advise go to ER Current medications prescribed by Dr. Karel Jarvis:   divalproex (DEPAKOTE) 500 MG DR tablet 1 tablet by mouth twice daily  cloBAZam (ONFI) 20 MG tablet Take 1.5 tablets (30 mg total) by mouth at night.

## 2023-12-26 NOTE — Telephone Encounter (Signed)
  Pt c/o: seizure Missed medications?  no Sleep deprived?  Has not been sleeping good,  has sleep test scheduled  Alcohol intake?  Yes he was drinking  Increased stress?  no Did you have any triggers?  no   If yes what? Any change in medication color or shape?  no Back to their usual baseline self? yes  . If no, advise go to ER Current medications prescribed by Dr. Karel Jarvis:   divalproex (DEPAKOTE) 500 MG DR tablet 1 tablet by mouth twice daily  cloBAZam (ONFI) 20 MG tablet Take 1.5 tablets (30 mg total) by mouth at night.    He has 10 seizures back to back. He needs something to help him sleep PCP gave him something for anxiety

## 2023-12-29 NOTE — Telephone Encounter (Signed)
 We can increase the Clobazam 20mg : Take 2 tablets every night. He will have to talk to PCP for sleep. Thanks

## 2024-01-01 NOTE — Telephone Encounter (Signed)
 Pt does not want to increase Clobazam he is going to talk to PCP gain about sleep medication and about sleep med. Referral because she will not give anything to help him sleep. To see if that will help he also want to be placed on wait list for sooner appointment ,

## 2024-01-01 NOTE — Telephone Encounter (Signed)
 Called to speak to Albert Gomez is out of town she gave me his mothers number is is working til 3 will call after 3pm [phone]

## 2024-01-17 ENCOUNTER — Other Ambulatory Visit (HOSPITAL_COMMUNITY): Payer: Self-pay

## 2024-01-18 ENCOUNTER — Other Ambulatory Visit: Payer: Self-pay

## 2024-01-30 ENCOUNTER — Other Ambulatory Visit (HOSPITAL_COMMUNITY): Payer: Self-pay

## 2024-01-30 ENCOUNTER — Encounter: Payer: Self-pay | Admitting: Neurology

## 2024-01-30 ENCOUNTER — Ambulatory Visit (INDEPENDENT_AMBULATORY_CARE_PROVIDER_SITE_OTHER): Admitting: Neurology

## 2024-01-30 VITALS — BP 111/69 | HR 75 | Ht 72.0 in | Wt 199.0 lb

## 2024-01-30 DIAGNOSIS — G4731 Primary central sleep apnea: Secondary | ICD-10-CM

## 2024-01-30 DIAGNOSIS — G40309 Generalized idiopathic epilepsy and epileptic syndromes, not intractable, without status epilepticus: Secondary | ICD-10-CM | POA: Diagnosis not present

## 2024-01-30 MED ORDER — DIVALPROEX SODIUM 500 MG PO DR TAB
500.0000 mg | DELAYED_RELEASE_TABLET | Freq: Two times a day (BID) | ORAL | 3 refills | Status: DC
  Filled 2024-01-30: qty 180, 90d supply, fill #0
  Filled 2024-04-29: qty 180, 90d supply, fill #1
  Filled 2024-07-21: qty 180, 90d supply, fill #2

## 2024-01-30 MED ORDER — VALTOCO 20 MG DOSE 2 X 10 MG/0.1ML NA LQPK
NASAL | 5 refills | Status: DC
  Filled 2024-01-30: qty 5, 2d supply, fill #0

## 2024-01-30 MED ORDER — CLOBAZAM 20 MG PO TABS
30.0000 mg | ORAL_TABLET | Freq: Every evening | ORAL | 4 refills | Status: DC
  Filled 2024-01-30: qty 135, 90d supply, fill #0
  Filled 2024-02-16: qty 45, 30d supply, fill #0

## 2024-01-30 NOTE — Patient Instructions (Addendum)
 Good to see you.  Continue all your medications. I also sent a prescription for Valtoco  nasal spray for seizure rescue  2. Please call the sleep lab for the in-lab sleep study [phone]  3. Follow-up as scheduled on July 30, 2024. Call for any changes   Seizure Precautions: 1. If medication has been prescribed for you to prevent seizures, take it exactly as directed.  Do not stop taking the medicine without talking to your doctor first, even if you have not had a seizure in a long time.   2. Avoid activities in which a seizure would cause danger to yourself or to others.  Don't operate dangerous machinery, swim alone, or climb in high or dangerous places, such as on ladders, roofs, or girders.  Do not drive unless your doctor says you may.  3. If you have any warning that you may have a seizure, lay down in a safe place where you can't hurt yourself.    4.  No driving for 6 months from last seizure, as per [place]  state law.   Please refer to the following link on the Epilepsy Foundation of America's website for more information: http://www.epilepsyfoundation.org/answerplace/Social/driving/drivingu.cfm   5.  Maintain good sleep hygiene. Avoid alcohol  6.  Contact your doctor if you have any problems that may be related to the medicine you are taking.  7.  Call 911 and bring the patient back to the ED if:        A.  The seizure lasts longer than 5 minutes.       B.  The patient doesn't awaken shortly after the seizure  C.  The patient has new problems such as difficulty seeing, speaking or moving  D.  The patient was injured during the seizure  E.  The patient has a temperature over 102 F (39C)  F.  The patient vomited and now is having trouble breathing

## 2024-01-30 NOTE — Progress Notes (Signed)
 NEUROLOGY FOLLOW UP OFFICE NOTE  Albert Gomez [id number] Apr 15, 2001  HISTORY OF PRESENT ILLNESS: I had the pleasure of seeing Albert Gomez in follow-up in the neurology clinic on 01/30/2024.  The patient was last seen 6 months ago for seizures. He is again accompanied by his significant other [name] who helps supplement the history today.  Records and images were personally reviewed where available.  He had been seizure-free for over 3 years until 12/24/23 when he had multiple seizures. ER reported generalized seizures, however [name] states he was not convulsing, they went out drinking and he had multiple shots. On the way home, he vomited then he thought they were trying to attack him and took off into the woods. When they found him, he was starig off several times, really aggressive in between. He reportedly had between 5 to 11 seizures. At times, he looks to the right and shakes one hand in the air. He had hit his head multiple times. He was given IM and IV Midazolam . Bloodwork showed WBC 12.7, EtOH level 156.  Depakote  level 73. Head CT no acute changes. In the ER, he had spells where he would stare into space with tachycardia, but he could be told to stop and he would stop these episodes. He denies any warning symptoms, he does not recall much except his whole body and ribs hurt. No tongue bite or incontinence. He denies missing medications, no sleep deprivation but has had difficulty with sleep initiation and maintenance. He had a sleep study in 11/2023 showing mild central sleep apnea. Recommendations were to consider in-center PSG, this was ordered but not yet scheduled. Seroquel 100mg  at bedtime was started recently which is helping him get 8-10 hours of sleep, no side effects. However, he has not noticed any change in the daytime drowsiness. Mood is "tired," he did not sleep okay last night. He is on Clobazam  30mg  at bedtime and Depakote  500mg  BID without side effects.    History on Initial  Assessment 04/04/2022: This is a 23 year old right-handed man with a history of depression, presenting to establish care for epilepsy. He is accompanied by his girlfriend [name] who helps supplement the history. Records from [facility] were reviewed and will be summarized below. Seizures started at age 23. He has a very brief 3-5 second warning where it feels like something is behind his head then he would lose consciousness and witnesses report convulsive activity. 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 He recalls taking Keppra  and Phenobarbital at one point. He reports that he was on Keppra  and Onfi , then at age 23 he was switched from Keppra  to Depakote  due to Keppra  rage. He is on Depakote  500mg  BID and Onfi  20mg  BID. They note that the convulsions significant reduced in intensity with Depakote , [name] notes he would have brief body jerking. He had 2 seizures in 2022, one on 03/29/21 in the setting of alcohol use, then the last one was around 06/2021 which was a nocturnal seizure triggered by sleep deprivation. After a seizure, he feels a weird feeling in his head, feeling spaced out and dizzy the whole day, just wanting to stay in bed. He and his girlfriend deny any staring/unresponsive episodes, gaps in time, olfactory/gustatory hallucinations. He has myoclonic jerks when he is sleep deprived. He tries hard to get 8 hours of sleep because he know that if he gets less, he would tend to have a seizure.   He denies any headaches, dizziness, diplopia, dysarthria/dysphagia, neck/back  pain, bowel/bladder dysfunction. Mood is okay, [name] reports he is moody sometimes. Memory is "terrible." He notes he is always sleepy and tired despite getting 8 hours of sleep. He snores when lying on his back. He lives with Rilla Check and his mother and brother. He is driving. He hopes to work for [employer] part-time.   Epilepsy Risk Factors:  Strong family history of seizures in his mother (started at age 61, off VPA  since age 47), mother's uncle, maternal aunt and cousin. He had head injuries from football (concussion in the 10th grade). He had a normal birth and early development.  There is no history of febrile convulsions, CNS infections such as meningitis/encephalitis, significant traumatic brain injury, neurosurgical procedures.   Prior ASMs: Keppra  ("Keppra  rage"), Phenobarbital  Diagnostic Data: EEGs: Routine EEG in 02/2017 showed interictal generalized but frontally predominant spike and wave discharges. In association with photic stimulation at frequencies from 9-19 Hz a photoparoxymal response was often noted which consisted of brief bursts of generalized spike and wave discharges. Only on one occasion at 19 Hz does the discharge outlast the photic stimulation. With hyperventilation, there was a burst of irregularly occurring fairly generalized spike and wave discharges with a frequency of 2-3 Hz lasting 3-4 seconds, no grossly evident behavioral correlate but no testing done.  2-day EMU evaluation at [facility] in April 2021 showed interictal discharges consistent with a primary generalized epilepsy. Push button events of aura and diffuse shaking had no EEG correlate. There was nonrhythmic shaking/jerking, tearfulness. They have not happened since April 2021   MRI of brain done 10/08/19 reported subcentimeter left anterior horn/periventricular cystic lesions are unchanged from 2019 imaging.   PAST MEDICAL HISTORY: Past Medical History:  Diagnosis Date   Anxiety    Concussion    Depression    Epilepsy (HCC)    Obsessive-compulsive disorder    Sleep disorder    Vomiting     MEDICATIONS: Current Outpatient Medications on File Prior to Visit  Medication Sig Dispense Refill   cloBAZam  (ONFI ) 20 MG tablet Take 1.5 tablets (30 mg total) by mouth at night. 135 tablet 4   divalproex  (DEPAKOTE ) 500 MG DR tablet Take 1 tablet by mouth twice daily 60 tablet 0   QUEtiapine (SEROQUEL) 50 MG tablet Take 25-100  mg by mouth at bedtime.     sertraline (ZOLOFT) 50 MG tablet Take 50 mg by mouth daily.     No current facility-administered medications on file prior to visit.    ALLERGIES: No Known Allergies  FAMILY HISTORY: Family History  Problem Relation Age of Onset   Lung cancer Paternal Grandmother        Died at 45    SOCIAL HISTORY: Social History   Socioeconomic History   Marital status: Single    Spouse name: Not on file   Number of children: Not on file   Years of education: Not on file   Highest education level: Not on file  Occupational History   Not on file  Tobacco Use   Smoking status: Never   Smokeless tobacco: Never  Vaping Use   Vaping status: Former  Substance and Sexual Activity   Alcohol use: Yes    Comment: 15 shots of liquar   Drug use: Not Currently    Types: Marijuana   Sexual activity: Yes    Partners: Female    Birth control/protection: Condom, Other-see comments    Comment: does not use protection every time  Other Topics Concern   Not on  file  Social History Narrative   Aaryan is currently working on his GED-does not attend high school.   He lives with his parents and 3 siblings.   Right handed   Drinks caffeine   One story home   Social Drivers of Health   Financial Resource Strain: Not on file  Food Insecurity: Not on file  Transportation Needs: Not on file  Physical Activity: Not on file  Stress: Not on file  Social Connections: Not on file  Intimate Partner Violence: Not on file     PHYSICAL EXAM: Vitals:   01/30/24 1107  BP: 111/69  Pulse: 75  SpO2: 100%   General: No acute distress Head:  Normocephalic/atraumatic Skin/Extremities: No rash, no edema Neurological Exam: alert and awake. No aphasia or dysarthria. Fund of knowledge is appropriate. Attention and concentration are normal.   Cranial nerves: Pupils equal, round. Extraocular movements intact with no nystagmus. Visual fields full.  No facial asymmetry.  Motor: Bulk and  tone normal, muscle strength 5/5 throughout with no pronator drift.   Finger to nose testing intact.  Gait narrow-based and steady, able to tandem walk adequately.  Romberg negative.   IMPRESSION: This is a 23 yo RH man with a history of depression with Primary Generalized Epilepsy. He had been seizure-free for over 3 years until  12/24/23 when he had several nonconvulsive seizures with report of staring followed by aggression. This occurred in the setting of binge drinking, alcohol level was very high. We agreed to continue on current doses and avoid alcohol moving forward. A prescription for prn Valtoco  nasal spray for seizure clusters was provided, side effects discussed. We discussed continued daytime drowsiness despite getting better sleep with Seroquel, his home sleep study raised concern for central sleep apnea, proceed with in-lab sleep study. He is aware of [place] driving laws to stop driving after a seizure until 6 months seizure-free. Follow-up as scheduled in 07/2024, call for any changes.    Thank you for allowing me to participate in his care.  Please do not hesitate to call for any questions or concerns.    Rayfield Cairo, M.D.   CC: Dr. Emmy Harper

## 2024-02-04 ENCOUNTER — Ambulatory Visit (HOSPITAL_BASED_OUTPATIENT_CLINIC_OR_DEPARTMENT_OTHER): Attending: Neurology | Admitting: Internal Medicine

## 2024-02-09 ENCOUNTER — Other Ambulatory Visit (HOSPITAL_COMMUNITY): Payer: Self-pay

## 2024-02-16 ENCOUNTER — Other Ambulatory Visit: Payer: Self-pay

## 2024-02-20 ENCOUNTER — Other Ambulatory Visit: Payer: Self-pay | Admitting: Neurology

## 2024-02-20 ENCOUNTER — Other Ambulatory Visit (HOSPITAL_COMMUNITY): Payer: Self-pay

## 2024-02-20 MED ORDER — DIVALPROEX SODIUM 500 MG PO DR TAB: 500.0000 mg | DELAYED_RELEASE_TABLET | Freq: Two times a day (BID) | ORAL | 3 refills | Status: DC

## 2024-02-20 MED ORDER — VALTOCO 20 MG DOSE 2 X 10 MG/0.1ML NA LQPK: NASAL | 5 refills | Status: AC

## 2024-02-20 MED ORDER — CLOBAZAM 20 MG PO TABS: 30.0000 mg | ORAL_TABLET | Freq: Every evening | ORAL | 4 refills | Status: DC

## 2024-02-20 NOTE — Telephone Encounter (Signed)
 Pt. Mom is asking for Sx Rx to be refilled, Pt. Is completely out of Sx meds

## 2024-02-20 NOTE — Telephone Encounter (Signed)
 Pt needs refills sent in

## 2024-02-24 ENCOUNTER — Ambulatory Visit (HOSPITAL_COMMUNITY)
Admission: EM | Admit: 2024-02-24 | Discharge: 2024-02-24 | Disposition: A | Discharge status: Home / Self Care | Attending: Emergency Medicine | Admitting: Emergency Medicine

## 2024-02-24 ENCOUNTER — Encounter (HOSPITAL_COMMUNITY): Payer: Self-pay

## 2024-02-24 DIAGNOSIS — Z760 Encounter for issue of repeat prescription: Secondary | ICD-10-CM | POA: Diagnosis not present

## 2024-02-24 DIAGNOSIS — F32A Depression, unspecified: Secondary | ICD-10-CM | POA: Diagnosis not present

## 2024-02-24 DIAGNOSIS — F419 Anxiety disorder, unspecified: Secondary | ICD-10-CM | POA: Diagnosis not present

## 2024-02-24 MED ORDER — SERTRALINE HCL 100 MG PO TABS: 100.0000 mg | ORAL_TABLET | Freq: Every day | ORAL | 0 refills | Status: DC

## 2024-02-24 NOTE — ED Provider Notes (Signed)
 [facility]    CSN: [id number] Arrival date & time: 02/24/24  1528      History   Chief Complaint Chief Complaint  Patient presents with   Medication Refill    HPI Albert Gomez is a 23 y.o. male.  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   Seizure (HCC) 06/03/2018   Vomiting 06/03/2018   Elevated lipase    Acute appendicitis, uncomplicated 11/26/2016   Problems with learning 09/14/2016   Insomnia 09/14/2016   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 Authorizing Provider  cloBAZam  (ONFI ) 20 MG tablet Take 1 and 1/2  tablets (30 mg total) by mouth at night. 02/20/24  Yes Jhonny Moss, MD  divalproex  (DEPAKOTE ) 500 MG DR tablet Take 1 tablet (500 mg total) by mouth 2 (two) times daily. 02/20/24  Yes  Jhonny Moss, MD  QUEtiapine (SEROQUEL) 50 MG tablet Take 25-100 mg by mouth at bedtime. 01/12/24  Yes [provider]  sertraline (ZOLOFT) 100 MG tablet Take 1 tablet (100 mg total) by mouth daily. 02/24/24  Yes Belford Pascucci, PA-C  diazePAM , 20 MG Dose, (VALTOCO  20 MG DOSE) 2 x 10 MG/0.1ML LQPK Administer one spray in one nostril, second spray in other nostril (one dose) as needed for seizure. May use second dose in 4 hours if needed. 02/20/24   Jhonny Moss, MD    Family History Family History  Problem Relation Age of Onset   Lung cancer Paternal Grandmother        Died at 74    Social History Social History   Tobacco Use   Smoking status: Never   Smokeless tobacco: Never  Vaping Use   Vaping status: Former  Substance Use Topics   Alcohol use: Yes    Comment: 15 shots of liquar   Drug use: Not Currently    Types: Marijuana     Allergies   Patient has no known allergies.   Review of Systems Review of Systems Per HPI  Physical Exam Triage Vital Signs ED Triage Vitals  Encounter Vitals Group     BP 02/24/24 1544 130/73     Systolic BP Percentile --      Diastolic BP Percentile --      Pulse Rate 02/24/24  1544 60     Resp 02/24/24 1544 16     Temp 02/24/24 1544 98.8 F (37.1 C)     Temp Source 02/24/24 1544 Oral     SpO2 02/24/24 1544 98 %     Weight --      Height --      Head Circumference --      Peak Flow --      Pain Score 02/24/24 1542 0     Pain Loc --      Pain Education --      Exclude from Growth Chart --    No data found.  Updated Vital Signs BP 130/73 (BP Location: Right Arm)   Pulse 60   Temp 98.8 F (37.1 C) (Oral)   Resp 16   SpO2 98%    Physical Exam Vitals and nursing note reviewed.  Constitutional:      General: He is not in acute distress.    Appearance: Normal appearance.  HENT:     Head: Atraumatic.     Mouth/Throat:     Pharynx: Oropharynx is clear.  Eyes:     General: Vision grossly intact.      Extraocular Movements: Extraocular movements intact.     Right eye: No nystagmus.     Left eye: No nystagmus.     Conjunctiva/sclera: Conjunctivae normal.     Pupils: Pupils are equal, round, and reactive to light.  Cardiovascular:     Rate and Rhythm: Normal rate and regular rhythm.     Pulses: Normal pulses.     Heart sounds: Normal heart sounds.  Pulmonary:     Effort: Pulmonary effort is normal.     Breath sounds: Normal breath sounds.  Musculoskeletal:        General: Normal range of motion.     Cervical back: Normal range of motion.  Skin:    General: Skin is warm and dry.  Neurological:     General: No focal deficit present.     Mental Status: He is alert and oriented to person, place, and time.     Cranial Nerves: No cranial nerve deficit.     Sensory: No sensory deficit.     Motor: No weakness.     Coordination: Coordination normal.     Gait: Gait normal.     UC Treatments / Results  Labs (all labs ordered are listed, but only abnormal results are displayed) Labs Reviewed - No data to display  EKG  Radiology No results found.  Procedures Procedures   Medications Ordered in UC Medications - No data to display  Initial Impression / Assessment and Plan / UC Course  I have reviewed the triage vital signs and the nursing notes.  Pertinent labs & imaging results that were available during my care of the patient were reviewed by me and considered in my medical decision making (see chart for details).  Set up with new PCP for July 1st Refilled zoloft 100 mg month supply Stable vitals and neurologically intact Advised return precautions Patient agrees to plan, no questions   Final Clinical Impressions(s) / UC Diagnoses   Final diagnoses:  Anxiety  Depression, unspecified depression type  Medication refill     Discharge Instructions      Zoloft 100 mg daily sent to pharmacy Hopefully you will notice improvement in the next 2-3 days being back on the  medicine If not, please return  New primary care details are below and attached!  ED Prescriptions  Medication Sig Dispense Auth. Provider   sertraline (ZOLOFT) 100 MG tablet Take 1 tablet (100 mg total) by mouth daily. 40 tablet Journei Thomassen, Ivette Marks, PA-C      PDMP not reviewed this encounter.   Creighton Doffing, PA-C 02/24/24 1615

## 2024-02-24 NOTE — Discharge Instructions (Addendum)
 Zoloft 100 mg daily sent to pharmacy Hopefully you will notice improvement in the next 2-3 days being back on the medicine If not, please return  New primary care details are below and attached!

## 2024-02-24 NOTE — ED Triage Notes (Signed)
 Pt presents for refill of Zoloft 100mg . Has been out for approx 3 days. Reports feeling "a little weird" and confused.

## 2024-03-26 ENCOUNTER — Encounter: Payer: Self-pay | Admitting: Family Medicine

## 2024-03-26 ENCOUNTER — Ambulatory Visit: Admitting: Family Medicine

## 2024-03-26 ENCOUNTER — Other Ambulatory Visit (HOSPITAL_COMMUNITY): Payer: Self-pay

## 2024-03-26 VITALS — BP 132/84 | HR 71 | Temp 98.5°F | Ht 72.0 in | Wt 200.0 lb

## 2024-03-26 DIAGNOSIS — Z1159 Encounter for screening for other viral diseases: Secondary | ICD-10-CM

## 2024-03-26 DIAGNOSIS — F411 Generalized anxiety disorder: Secondary | ICD-10-CM | POA: Diagnosis not present

## 2024-03-26 DIAGNOSIS — Z7689 Persons encountering health services in other specified circumstances: Secondary | ICD-10-CM

## 2024-03-26 DIAGNOSIS — R5383 Other fatigue: Secondary | ICD-10-CM | POA: Diagnosis not present

## 2024-03-26 DIAGNOSIS — E663 Overweight: Secondary | ICD-10-CM

## 2024-03-26 MED ORDER — SERTRALINE HCL 100 MG PO TABS
100.0000 mg | ORAL_TABLET | Freq: Every day | ORAL | 1 refills | Status: DC
  Filled 2024-03-26: qty 90, 90d supply, fill #0
  Filled 2024-07-02: qty 90, 90d supply, fill #1

## 2024-03-26 NOTE — Progress Notes (Unsigned)
 New Patient Office Visit  Subjective   Patient ID: Albert Gomez, male    DOB: 07-Sep-2001  Age: 23 y.o. MRN: [id number]  CC:  Chief Complaint  Patient presents with   Establish Care    HPI Albert Gomez presents to establish care with new provider.  Patients previous primary care: [facility] with Dr. Chesley Eke. Last seen: 3-4 months ago.   Specialist:  Neurology with Dr. Darice Shivers   Anxiety: Chronic. Patient is prescribed Sertraline  100mg  daily.   Patient is complaining of fatigue for the years. Stay consistently for a while. Seems to drink a lot of coffee. Suppose to have a sleep study with neurology.  Outpatient Encounter Medications as of 03/26/2024  Medication Sig   cloBAZam  (ONFI ) 20 MG tablet Take 1 and 1/2  tablets (30 mg total) by mouth at night.   diazePAM , 20 MG Dose, (VALTOCO  20 MG DOSE) 2 x 10 MG/0.1ML LQPK Administer one spray in one nostril, second spray in other nostril (one dose) as needed for seizure. May use second dose in 4 hours if needed.   divalproex  (DEPAKOTE ) 500 MG DR tablet Take 1 tablet (500 mg total) by mouth 2 (two) times daily.   [DISCONTINUED] QUEtiapine (SEROQUEL) 50 MG tablet Take 25-100 mg by mouth at bedtime.   [DISCONTINUED] sertraline  (ZOLOFT ) 100 MG tablet Take 1 tablet (100 mg total) by mouth daily.   sertraline  (ZOLOFT ) 100 MG tablet Take 1 tablet (100 mg total) by mouth daily.   No facility-administered encounter medications on file as of 03/26/2024.    Past Medical History:  Diagnosis Date   Anxiety    Concussion    Depression    Epilepsy (HCC)    Obsessive-compulsive disorder    Sleep disorder    Vomiting     Past Surgical History:  Procedure Laterality Date   CIRCUMCISION  2001   HAND SURGERY     LAPAROSCOPIC APPENDECTOMY N/A 11/26/2016   Procedure: APPENDECTOMY LAPAROSCOPIC;  Surgeon: Casimiro MALVA Matte, MD;  Location: [facility];  Service: General;  Laterality: N/A;   WISDOM TOOTH  EXTRACTION      Family History  Problem Relation Age of Onset   Epilepsy Mother    Lung cancer Paternal Grandmother        Died at 51   Parkinson's disease Paternal Grandfather     Social History   Socioeconomic History   Marital status: Single    Spouse name: Not on file   Number of children: 0   Years of education: Not on file   Highest education level: Some college, no degree  Occupational History   Not on file  Tobacco Use   Smoking status: Never   Smokeless tobacco: Current    Types: Chew  Vaping Use   Vaping status: Former  Substance and Sexual Activity   Alcohol use: Yes    Comment: Once a week .   Drug use: Yes    Types: Marijuana   Sexual activity: Yes    Partners: Female    Birth control/protection: Condom, Other-see comments    Comment: does not use protection every time  Other Topics Concern   Not on file  Social History Narrative   Not on file   Social Drivers of Health   Financial Resource Strain: Low Risk  (03/26/2024)   Overall Financial Resource Strain (CARDIA)    Difficulty of Paying Living Expenses: Not hard at all  Food Insecurity: No Food Insecurity (03/26/2024)  Hunger Vital Sign    Worried About Running Out of Food in the Last Year: Never true    Ran Out of Food in the Last Year: Never true  Transportation Needs: No Transportation Needs (03/26/2024)   PRAPARE - Administrator, Civil Service (Medical): No    Lack of Transportation (Non-Medical): No  Physical Activity: Sufficiently Active (03/26/2024)   Exercise Vital Sign    Days of Exercise per Week: 5 days    Minutes of Exercise per Session: 50 min  Stress: No Stress Concern Present (03/26/2024)   [employer] of Occupational Health - Occupational Stress Questionnaire    Feeling of Stress: Not at all  Social Connections: Moderately Isolated (03/26/2024)   Social Connection and Isolation Panel    Frequency of Communication with Friends and Family: More than three times a week     Frequency of Social Gatherings with Friends and Family: Twice a week    Attends Religious Services: Never    Database administrator or Organizations: No    Attends Banker Meetings: Never    Marital Status: Living with partner  Intimate Partner Violence: Not At Risk (03/26/2024)   Humiliation, Afraid, Rape, and Kick questionnaire    Fear of Current or Ex-Partner: No    Emotionally Abused: No    Physically Abused: No    Sexually Abused: No    ROS See HPI above    Objective  BP 132/84   Pulse 71   Temp 98.5 F (36.9 C) (Oral)   Ht 6' (1.829 m)   Wt 200 lb (90.7 kg)   SpO2 99%   BMI 27.12 kg/m   Physical Exam    Assessment & Plan:  Anxiety state -     Sertraline  HCl; Take 1 tablet (100 mg total) by mouth daily.  Dispense: 90 tablet; Refill: 1  Fatigue, unspecified type -     CBC with Differential/Platelet -     Testosterone -     TSH -     Vitamin B12 -     VITAMIN D 25 Hydroxy (Vit-D Deficiency, Fractures)  Overweight -     CBC with Differential/Platelet -     Comprehensive metabolic panel with GFR -     Hemoglobin A1c -     Lipid panel  Need for hepatitis C screening test -     Hepatitis C antibody  1.Review health maintenance: -Covid booster: Declines  -Tdap vaccine: Recommend to obtain every 10 years and if injury occurred  -Hep B vaccine: Yes, unknown when  -HPV vaccine: Yes, unknown when  -Hep C screening: Order  -Meningococcal B vaccine: Yes, unknown when    No follow-ups on file.   Albert Soderholm, NP

## 2024-03-26 NOTE — Patient Instructions (Signed)
-  It was nice to meet you and look forward to taking care of you.  -Ordered labs. Office will call with lab results and will be available on MyChart.  -Refilled Sertraline  100mg  daily.  -Follow up in 1 year for a physical.

## 2024-03-27 ENCOUNTER — Ambulatory Visit: Payer: Self-pay | Admitting: Family Medicine

## 2024-03-27 DIAGNOSIS — R5383 Other fatigue: Secondary | ICD-10-CM

## 2024-03-27 LAB — LIPID PANEL
Cholesterol: 158 mg/dL (ref 0–200)
HDL: 43.6 mg/dL (ref >39.00)
LDL Cholesterol: 96 mg/dL (ref 0–99)
NonHDL: 114.71
Total CHOL/HDL Ratio: 4
Triglycerides: 96 mg/dL (ref 0.0–149.0)
VLDL: 19.2 mg/dL (ref 0.0–40.0)

## 2024-03-27 LAB — CBC WITH DIFFERENTIAL/PLATELET
Basophils Absolute: 0 10*3/uL (ref 0.0–0.1)
Basophils Relative: 0.7 % (ref 0.0–3.0)
Eosinophils Absolute: 0 10*3/uL (ref 0.0–0.7)
Eosinophils Relative: 0.9 % (ref 0.0–5.0)
HCT: 44.7 % (ref 39.0–52.0)
Hemoglobin: 15.7 g/dL (ref 13.0–17.0)
Lymphocytes Relative: 42.8 % (ref 12.0–46.0)
Lymphs Abs: 2.2 10*3/uL (ref 0.7–4.0)
MCHC: 35.1 g/dL (ref 30.0–36.0)
MCV: 90.1 fl (ref 78.0–100.0)
Monocytes Absolute: 0.4 10*3/uL (ref 0.1–1.0)
Monocytes Relative: 8.7 % (ref 3.0–12.0)
Neutro Abs: 2.4 10*3/uL (ref 1.4–7.7)
Neutrophils Relative %: 46.9 % (ref 43.0–77.0)
Platelets: 191 10*3/uL (ref 150.0–400.0)
RBC: 4.97 Mil/uL (ref 4.22–5.81)
RDW: 11.9 % (ref 11.5–15.5)
WBC: 5.1 10*3/uL (ref 4.0–10.5)

## 2024-03-27 LAB — COMPREHENSIVE METABOLIC PANEL WITH GFR
ALT: 16 U/L (ref 0–53)
AST: 25 U/L (ref 0–37)
Albumin: 4.9 g/dL (ref 3.5–5.2)
Alkaline Phosphatase: 63 U/L (ref 39–117)
BUN: 12 mg/dL (ref 6–23)
CO2: 32 meq/L (ref 19–32)
Calcium: 9.2 mg/dL (ref 8.4–10.5)
Chloride: 101 meq/L (ref 96–112)
Creatinine, Ser: 0.95 mg/dL (ref 0.40–1.50)
GFR: 112.66 mL/min (ref >60.00)
Glucose, Bld: 92 mg/dL (ref 70–99)
Potassium: 4.1 meq/L (ref 3.5–5.1)
Sodium: 141 meq/L (ref 135–145)
Total Bilirubin: 0.6 mg/dL (ref 0.2–1.2)
Total Protein: 7.1 g/dL (ref 6.0–8.3)

## 2024-03-27 LAB — TSH: TSH: 1.68 u[IU]/mL (ref 0.35–5.50)

## 2024-03-27 LAB — TESTOSTERONE: Testosterone: 415.82 ng/dL (ref 300.00–890.00)

## 2024-03-27 LAB — HEPATITIS C ANTIBODY: Hepatitis C Ab: NONREACTIVE

## 2024-03-27 LAB — HEMOGLOBIN A1C: Hgb A1c MFr Bld: 5.3 % (ref 4.6–6.5)

## 2024-03-27 LAB — VITAMIN B12: Vitamin B-12: 639 pg/mL (ref 211–911)

## 2024-03-27 LAB — VITAMIN D 25 HYDROXY (VIT D DEFICIENCY, FRACTURES): VITD: 37.49 ng/mL (ref 30.00–100.00)

## 2024-03-27 NOTE — Assessment & Plan Note (Signed)
 Stable. Continue taking Sertraline  100mg  daily. Refilled medication.

## 2024-04-04 ENCOUNTER — Other Ambulatory Visit (HOSPITAL_COMMUNITY): Payer: Self-pay

## 2024-04-08 ENCOUNTER — Encounter (HOSPITAL_BASED_OUTPATIENT_CLINIC_OR_DEPARTMENT_OTHER): Admitting: Internal Medicine

## 2024-04-09 ENCOUNTER — Encounter: Payer: Self-pay | Admitting: Nurse Practitioner

## 2024-04-19 ENCOUNTER — Other Ambulatory Visit (HOSPITAL_COMMUNITY): Payer: Self-pay

## 2024-04-29 ENCOUNTER — Other Ambulatory Visit: Payer: Self-pay | Admitting: Neurology

## 2024-04-29 ENCOUNTER — Other Ambulatory Visit (HOSPITAL_COMMUNITY): Payer: Self-pay

## 2024-04-29 MED ORDER — CLOBAZAM 20 MG PO TABS
30.0000 mg | ORAL_TABLET | Freq: Every evening | ORAL | 4 refills | Status: DC
  Filled 2024-04-29: qty 135, 90d supply, fill #0
  Filled 2024-05-20 (×2): qty 45, 30d supply, fill #0
  Filled 2024-06-17: qty 45, 30d supply, fill #1
  Filled 2024-07-18: qty 45, 30d supply, fill #2

## 2024-04-29 MED ORDER — VALTOCO 20 MG DOSE 2 X 10 MG/0.1ML NA LQPK
NASAL | 5 refills | Status: AC
  Filled 2024-04-29 – 2024-05-20 (×2): qty 5, 30d supply, fill #0

## 2024-05-09 ENCOUNTER — Other Ambulatory Visit (HOSPITAL_COMMUNITY): Payer: Self-pay

## 2024-05-20 ENCOUNTER — Other Ambulatory Visit (HOSPITAL_COMMUNITY): Payer: Self-pay

## 2024-05-20 ENCOUNTER — Encounter: Payer: Self-pay | Admitting: Pharmacist

## 2024-05-20 ENCOUNTER — Other Ambulatory Visit: Payer: Self-pay

## 2024-06-09 ENCOUNTER — Ambulatory Visit (HOSPITAL_BASED_OUTPATIENT_CLINIC_OR_DEPARTMENT_OTHER): Attending: Neurology | Admitting: Internal Medicine

## 2024-06-09 DIAGNOSIS — G4733 Obstructive sleep apnea (adult) (pediatric): Secondary | ICD-10-CM | POA: Diagnosis not present

## 2024-06-09 DIAGNOSIS — R400 Somnolence: Secondary | ICD-10-CM | POA: Diagnosis present

## 2024-06-16 DIAGNOSIS — G4733 Obstructive sleep apnea (adult) (pediatric): Secondary | ICD-10-CM

## 2024-06-16 NOTE — Procedures (Signed)
 [facility] [address] [place], [place] [address] Tel: [phone]   Fax: [phone]  Polysomnography Interpretation  Patient Name:  Albert Gomez, Albert Gomez Date:  06/09/2024 Referring Physician:  - %%startinterp%% Indications for Polysomnography The patient is a 23 year old Male who is 6' and weighs 210.0 lbs. His BMI equals 28.8.  A full night polysomnogram was performed to evaluate for -OSA.  Medications Taken:  NO MEDICATIONS TAKEN.   Polysomnogram Data A full night polysomnogram recorded the standard physiologic parameters including EEG, EOG, EMG, EKG, nasal and oral airflow.  Respiratory parameters of chest and abdominal movements were recorded with Respiratory Inductance Plethysmography belts.  Oxygen saturation was recorded by pulse oximetry.   Sleep Architecture The total recording time of the polysomnogram was 463.4 minutes.  The total sleep time was 376.5 minutes.  The patient spent 2.8% of total sleep time in Stage N1, 65.5% in Stage N2, 20.6% in Stages N3, and 11.2% in REM.  Sleep latency was 45.6 minutes.  REM latency was 276.5 minutes.  Sleep Efficiency was 81.2%.  Wake after Sleep Onset time was 41.5 minutes.  Respiratory Events The polysomnogram revealed a presence of 1 obstructive, 3 central, and 1 mixed apnea resulting in an Apnea index of 0.8 events per hour.  There were 28 hypopneas (>=3% desaturation and/or arousal) resulting in an Apnea\Hypopnea Index (AHI >=3% desaturation and/or arousal) of 5.3 events per hour.  There were 10 hypopneas (>=4% desaturation) resulting in an Apnea\Hypopnea Index (AHI >=4% desaturation) of 2.4 events per hour.  There were 73 Respiratory Effort Related Arousals resulting in a RERA index of 11.6 events per hour. The Respiratory Disturbance Index is 16.9 events per hour.  The snore index was 40.3 events per hour.  Mean oxygen saturation was 94.6%.  The lowest oxygen saturation during sleep was  88.0%.  Time spent <=88% oxygen saturation was 0.1 minutes (-).  Limb Activity There were - total limb movements recorded, of this total, - were classified as PLMs.  PLM index was - per hour and PLM associated with Arousals index was - per hour.  Cardiac Summary The average pulse rate was 54.0 bpm.  The minimum pulse rate was 42.0 bpm while the maximum pulse rate was 89.0 bpm.  Cardiac rhythm was normal.  Comments:  Minimal obstructive sleep apnea, AHI(3%) 5.3/hr. Scores in this range are unlikely to be clinically significant. Mild snoring with oxygen desaturation to a nadir of 88%, mean 94.6%.  No significant parasomnias noted. See tech comment at end of report.  Diagnosis:  Obstructive sleep apnea- very mild  Recommendations: None   This study was personally reviewed and electronically signed by: NEYSA REGGY BIRCH, MD Accredited Board Certified in Sleep Medicine Date/Time: 06/16/24   12:11    %%endinterp%%   Diagnostic PSG Report  Patient Name: Albert Gomez, Albert Gomez Date: 06/09/2024  Date of Birth: 2001-04-25 Study Type: Diagnostic  Age: 33 year MRN #: [id number]  Sex: Male Interpreting Physician: NEYSA REGGY, [id number]  Height: 6' Referring Physician: -  Weight: 210.0 lbs Recording Tech: Charlie George RPSGT  BMI: 28.8 Scoring Tech: Charlie George RPSGT  ESS: 1 Neck Size: 16   Study Overview  Lights Off: 09:27:30 PM  Count Index  Lights On: 05:10:57 AM Awakenings: 19 3.0  Time in Bed: 463.4 min. Arousals: 86 13.7  Total Sleep Time: 376.5 min. AHI (>=3% Desat and/or Ar.): 33 5.3   Sleep Efficiency: 81.2% AHI (>=4% Desat): 15 2.4   Sleep Latency: 45.6  min. Limb Movements: - -  Wake After Sleep Onset: 41.5 min. Snore: 253 40.3  REM Latency from Sleep Onset: 276.5 min. Desaturations: 33 5.3     Minimum SpO2 TST: 88.0%    Sleep Architecture  % of Time in Bed Stages Time (mins) % Sleep Time  Wake 87.5   Stage N1 10.5 2.8%  Stage N2 246.5 65.5%  Stage N3 77.5 20.6%   REM 42.0 11.2%   Arousal Summary   NREM REM Sleep Index  Respiratory Arousals 72 3 75 12.0  PLM Arousals - - - -  Isolated Limb Movement Arousals - - - -  Snore Arousals 1 - 1 0.2  Spontaneous Arousals 10 - 10 1.6  Total 83 3 86 13.7   Limb Movement Summary   Count Index  Isolated Limb Movements - -  Periodic Limb Movements (PLMs) - -  Total Limb Movements - -    Respiratory Summary   By Sleep Stage By Body Position Total   NREM REM Supine Non-Supine   Time (min) 334.5 42.0 50.5 326.0 376.5         Obstructive Apnea 1 - - 1 1  Mixed Apnea - 1 - 1 1  Central Apnea - 3 - 3 3  Total Apneas 1 4 - 5 5  Total Apnea Index 0.2 5.7 - 0.9 0.8         Hypopneas (>=3% Desat and/or Ar.) 25 3 - 28 28  AHI (>=3% Desat and/or Ar.) 4.7 10.0 - 6.1 5.3         Hypopneas (>=4% Desat) 10 - - 10 10  AHI (>=4% Desat) 2.0 5.7 - 2.8 2.4          RERAs 69 4 5 68 73  RERA Index 12.4 5.7 5.9 12.5 11.6         RDI 17.0 15.7 5.9 18.6 16.9    Respiratory Event Type Index  Central Apneas 0.5  Obstructive Apneas 0.2  Mixed Apneas 0.2  Central Hypopneas -  Obstructive Hypopneas 4.3  Central Apnea + Hypopnea (CAHI) 0.5  Obstructive Apnea + Hypopnea (OAHI) 4.8   Respiratory Event Durations   Apnea Hypopnea   NREM REM NREM REM  Average (seconds) 13.0 12.9 22.7 30.1  Maximum (seconds) 13.0 15.9 42.9 34.7    Oxygen Saturation Summary   Wake NREM REM TST TIB  Average SpO2 (%) 95.7% 94.3% 94.9% 94.3% 94.6%  Minimum SpO2 (%) 91.0% 88.0% 91.0% 88.0% 88.0%  Maximum SpO2 (%) 99.0% 98.0% 98.0% 98.0% 99.0%   Oxygen Saturation Distribution  Range (%) Time in range (min) Time in range (%)  90.0 - 100.0 453.2 98.8%  80.0 - 90.0 0.8 0.2%  70.0 - 80.0 - -  60.0 - 70.0 - -  50.0 - 60.0 - -  0.0 - 50.0 - -  Time Spent <=88% SpO2  Range (%) Time in range (min) Time in range (%)  0.0 - 88.0 0.1 0.0%      Count Index  Desaturations 33 5.3    Cardiac Summary   Wake NREM REM Sleep  Total  Average Pulse Rate (BPM) 58.0 52.9 54.8 53.1 54.0  Minimum Pulse Rate (BPM) 47.0 42.0 45.0 42.0 42.0  Maximum Pulse Rate (BPM) 84.0 89.0 68.0 89.0 89.0   Pulse Rate Distribution:  Range (bpm) Time in range (min) Time in range (%)  0.0 - 40.0 - -  40.0 - 60.0 422.6 92.0%  60.0 - 80.0 31.4 6.8%  80.0 - 100.0  0.5 0.1%  100.0 - 120.0 - -  120.0 - 140.0 - -  140.0 - 200.0 - -      Hypnograms                      Technologist Comments  The patient arrived for a diagnostic PSG study. The patient was placed in room 5. The procedure was explained, and the patient had no questions.  The patient slept in the supine, left, and right positions. Bruxism was observed. No PLMs, oral venting, seizure, or spike wave activity was observed. Snoring was moderate, but not audible. No ECG events were observed. All stages of sleep were recorded. The patient had one-bathroom break.                           Reggy Salt Diplomate, Biomedical engineer of Sleep Medicine  ELECTRONICALLY SIGNED ON:  06/16/2024, 12:07 PM [facility] PH: [phone]   FX: [phone] ACCREDITED BY THE AMERICAN ACADEMY OF SLEEP MEDICINE

## 2024-06-18 ENCOUNTER — Other Ambulatory Visit: Payer: Self-pay

## 2024-07-03 ENCOUNTER — Encounter: Payer: Self-pay | Admitting: *Deleted

## 2024-07-03 NOTE — Progress Notes (Signed)
 Sent [facility] message

## 2024-07-19 ENCOUNTER — Other Ambulatory Visit: Payer: Self-pay

## 2024-07-22 ENCOUNTER — Telehealth: Payer: Self-pay | Admitting: Neurology

## 2024-07-22 ENCOUNTER — Other Ambulatory Visit (HOSPITAL_COMMUNITY): Payer: Self-pay

## 2024-07-22 NOTE — Telephone Encounter (Signed)
 Pt states her husband is out of his epilepsy medication. He is completely out.   Depakote  and closbazam  [facility] [place]

## 2024-07-22 NOTE — Telephone Encounter (Signed)
 Pt called no answer left a voice mail to call back when he calls back he need to call the [id number] [place] [place], [place] - [address] have then transfer his RX he [facility] [place] he has refills on file

## 2024-07-22 NOTE — Telephone Encounter (Signed)
 Pt was able to pick up both of his medications

## 2024-07-29 ENCOUNTER — Ambulatory Visit: Payer: Medicaid Other | Admitting: Neurology

## 2024-07-30 ENCOUNTER — Other Ambulatory Visit (HOSPITAL_COMMUNITY): Payer: Self-pay

## 2024-07-30 ENCOUNTER — Encounter: Payer: Self-pay | Admitting: Neurology

## 2024-07-30 ENCOUNTER — Ambulatory Visit: Payer: Medicaid Other | Admitting: Neurology

## 2024-07-30 VITALS — BP 132/82 | HR 80 | Ht 72.0 in | Wt 207.4 lb

## 2024-07-30 DIAGNOSIS — G40309 Generalized idiopathic epilepsy and epileptic syndromes, not intractable, without status epilepticus: Secondary | ICD-10-CM | POA: Diagnosis not present

## 2024-07-30 MED ORDER — DIVALPROEX SODIUM 500 MG PO DR TAB
500.0000 mg | DELAYED_RELEASE_TABLET | Freq: Two times a day (BID) | ORAL | 3 refills | Status: AC
  Filled 2024-07-30: qty 180, 90d supply, fill #0

## 2024-07-30 MED ORDER — CLOBAZAM 20 MG PO TABS
30.0000 mg | ORAL_TABLET | Freq: Every evening | ORAL | 4 refills | Status: DC
  Filled 2024-07-30: qty 135, 90d supply, fill #0
  Filled 2024-08-21: qty 45, 30d supply, fill #0
  Filled 2024-09-16 – 2024-09-20 (×2): qty 45, 30d supply, fill #1

## 2024-07-30 NOTE — Patient Instructions (Signed)
 Good to see you doing better!  Continue all your seizure medications  2. Continue working on sleep hygiene. Try magnesium  glycinate 400mg  every night and see if this helps with sleep. If not, please discuss with your family doctor  3. Follow-up in 6 months, call for any changes   Seizure Precautions: 1. If medication has been prescribed for you to prevent seizures, take it exactly as directed.  Do not stop taking the medicine without talking to your doctor first, even if you have not had a seizure in a long time.   2. Avoid activities in which a seizure would cause danger to yourself or to others.  Don't operate dangerous machinery, swim alone, or climb in high or dangerous places, such as on ladders, roofs, or girders.  Do not drive unless your doctor says you may.  3. If you have any warning that you may have a seizure, lay down in a safe place where you can't hurt yourself.    4.  No driving for 6 months from last seizure, as per [place]  state law.   Please refer to the following link on the Epilepsy Foundation of America's website for more information: http://www.epilepsyfoundation.org/answerplace/Social/driving/drivingu.cfm   5.  Maintain good sleep hygiene. Avoid alcohol  6.  Contact your doctor if you have any problems that may be related to the medicine you are taking.  7.  Call 911 and bring the patient back to the ED if:        A.  The seizure lasts longer than 5 minutes.       B.  The patient doesn't awaken shortly after the seizure  C.  The patient has new problems such as difficulty seeing, speaking or moving  D.  The patient was injured during the seizure  E.  The patient has a temperature over 102 F (39C)  F.  The patient vomited and now is having trouble breathing

## 2024-07-30 NOTE — Progress Notes (Signed)
 NEUROLOGY FOLLOW UP OFFICE NOTE  Albert Gomez [id number] Mar 26, 2001  HISTORY OF PRESENT ILLNESS: I had the pleasure of seeing Albert Gomez in follow-up in the neurology clinic on 07/30/2024.  The patient was last seen 6 months ago for seizures. He is again accompanied by his significant other Albert Gomez who helps supplement the history today.  Records and images were personally reviewed where available. He had a cluster of seizures in 11/2023 in the setting of alcohol intake. They deny any seizures since then, he has continued on Depakote  500mg  BID and Clobazam  30mg  at bedtime (20mg  1.5 tabs at bedtime). No staring/unresponsive episodes. He was reporting continued fatigue and sleep dysfunction despite taking Seroquel. An in-lab sleep study was done 05/2024 which showed minimal OSA, scores unlikely to be clinically significant. 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 He gets up in the morning and does not feel bad. His thoughts are also clearer, brain fog is better. Albert Gomez can tell that the day prior to his weekly injection, he is more sleepy/groggy. He feels sleep has gotten better but he still has difficulty with sleep initiation. He tried melatonin as well. He is off Seroquel.    History on Initial Assessment 04/04/2022: This is a 23 year old right-handed man with a history of depression, presenting to establish care for epilepsy. He is accompanied by his girlfriend Albert Gomez who helps supplement the history. Records from [facility] were reviewed and will be summarized below. Seizures started at age 17. He has a very brief 3-5 second warning where it feels like something is behind his head then he would lose consciousness and witnesses report convulsive activity. 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 He recalls taking Keppra  and Phenobarbital at one point. He reports that he was on Keppra  and Onfi , then at age  [age] he was switched from Keppra  to Depakote  due to Keppra  rage. He is on Depakote  500mg  BID and Onfi  20mg  BID. They note that the convulsions significant reduced in intensity with Depakote , Albert Gomez notes he would have brief body jerking. He had 2 seizures in 2022, one on 03/29/21 in the setting of alcohol use, then the last one was around 06/2021 which was a nocturnal seizure triggered by sleep deprivation. After a seizure, he feels a weird feeling in his head, feeling spaced out and dizzy the whole day, just wanting to stay in bed. He and his girlfriend deny any staring/unresponsive episodes, gaps in time, olfactory/gustatory hallucinations. He has myoclonic jerks when he is sleep deprived. He tries hard to get 8 hours of sleep because he know that if he gets less, he would tend to have a seizure.   He denies any headaches, dizziness, diplopia, dysarthria/dysphagia, neck/back pain, bowel/bladder dysfunction. Mood is okay, Albert Gomez reports he is moody sometimes. Memory is terrible. He notes he is always sleepy and tired despite getting 8 hours of sleep. He snores when lying on his back. He lives with Albert Gomez and his mother and brother. He is driving. He hopes to work for [employer] part-time.   Epilepsy Risk Factors:  Strong family history of seizures in his mother (started at age 24, off VPA since age 39), mother's uncle, maternal aunt and cousin. He had head injuries from football (concussion in the 10th grade). He had a normal birth and early development.  There is no history of febrile convulsions, CNS infections such as meningitis/encephalitis, significant traumatic brain injury, neurosurgical  procedures.   Prior ASMs: Keppra  (Keppra  rage), Phenobarbital  Diagnostic Data: EEGs: Routine EEG in 02/2017 showed interictal generalized but frontally predominant spike and wave discharges. In association with photic stimulation at frequencies from 9-19 Hz a photoparoxymal response was often noted which consisted of  brief bursts of generalized spike and wave discharges. Only on one occasion at 19 Hz does the discharge outlast the photic stimulation. With hyperventilation, there was a burst of irregularly occurring fairly generalized spike and wave discharges with a frequency of 2-3 Hz lasting 3-4 seconds, no grossly evident behavioral correlate but no testing done.  2-day [facility] evaluation [facility] in April 2021 showed interictal discharges consistent with a primary generalized epilepsy. Push button events of aura and diffuse shaking had no EEG correlate. There was nonrhythmic shaking/jerking, tearfulness. They have not happened since April 2021   MRI of brain done 10/08/19 reported subcentimeter left anterior horn/periventricular cystic lesions are unchanged from 2019 imaging.   PAST MEDICAL HISTORY: Past Medical History:  Diagnosis Date   Anxiety    Concussion    Depression    Epilepsy (HCC)    Obsessive-compulsive disorder    Sleep disorder    Vomiting     MEDICATIONS: Current Outpatient Medications on File Prior to Visit  Medication Sig Dispense Refill   cloBAZam  (ONFI ) 20 MG tablet Take 1 and 1/2  tablets (30 mg total) by mouth at night. 135 tablet 4   cloBAZam  (ONFI ) 20 MG tablet Take 1 and 1/2  tablets (30 mg total) by mouth at night. 135 tablet 4   diazePAM , 20 MG Dose, (VALTOCO  20 MG DOSE) 2 x 10 MG/0.1ML LQPK Administer one spray in one nostril, second spray in other nostril (one dose) as needed for seizure. May use second dose in 4 hours if needed. 10 each 5   diazePAM , 20 MG Dose, (VALTOCO  20 MG DOSE) 2 x 10 MG/0.1ML LQPK Administer one spray in one nostril, second spray in other nostril (one dose) as needed for seizure. May use second dose in 4 hours if needed. 5 each 5   divalproex  (DEPAKOTE ) 500 MG DR tablet Take 1 tablet (500 mg total) by mouth 2 (two) times daily. 180 tablet 3   divalproex  (DEPAKOTE ) 500 MG DR tablet Take 1 tablet (500 mg total) by mouth 2 (two) times daily. 180 tablet 3    sertraline  (ZOLOFT ) 100 MG tablet Take 1 tablet (100 mg total) by mouth daily. 90 tablet 1   testosterone  cypionate (DEPOTESTOTERONE CYPIONATE) 100 MG/ML injection Inject into the muscle once a week. For IM use only     No current facility-administered medications on file prior to visit.    ALLERGIES: No Known Allergies  FAMILY HISTORY: Family History  Problem Relation Age of Onset   Epilepsy Mother    Lung cancer Paternal Grandmother        Died at 32   Parkinson's disease Paternal Grandfather     SOCIAL HISTORY: Social History   Socioeconomic History   Marital status: Single    Spouse name: Not on file   Number of children: 0   Years of education: Not on file   Highest education level: Some college, no degree  Occupational History   Not on file  Tobacco Use   Smoking status: Never   Smokeless tobacco: Current    Types: Chew  Vaping Use   Vaping status: Former  Substance and Sexual Activity   Alcohol use: Yes    Comment: Once a week .  Drug use: Yes    Types: Marijuana   Sexual activity: Yes    Partners: Female    Birth control/protection: Condom, Other-see comments    Comment: does not use protection every time  Other Topics Concern   Not on file  Social History Narrative   Not on file   Social Drivers of Health   Financial Resource Strain: Low Risk  (03/26/2024)   Overall Financial Resource Strain (CARDIA)    Difficulty of Paying Living Expenses: Not hard at all  Food Insecurity: No Food Insecurity (03/26/2024)   Hunger Vital Sign    Worried About Running Out of Food in the Last Year: Never true    Ran Out of Food in the Last Year: Never true  Transportation Needs: No Transportation Needs (03/26/2024)   PRAPARE - Administrator, Civil Service (Medical): No    Lack of Transportation (Non-Medical): No  Physical Activity: Sufficiently Active (03/26/2024)   Exercise Vital Sign    Days of Exercise per Week: 5 days    Minutes of Exercise per Session:  50 min  Stress: No Stress Concern Present (03/26/2024)   [employer] of Occupational Health - Occupational Stress Questionnaire    Feeling of Stress: Not at all  Social Connections: Moderately Isolated (03/26/2024)   Social Connection and Isolation Panel    Frequency of Communication with Friends and Family: More than three times a week    Frequency of Social Gatherings with Friends and Family: Twice a week    Attends Religious Services: Never    Database Administrator or Organizations: No    Attends Banker Meetings: Never    Marital Status: Living with partner  Intimate Partner Violence: Not At Risk (03/26/2024)   Humiliation, Afraid, Rape, and Kick questionnaire    Fear of Current or Ex-Partner: No    Emotionally Abused: No    Physically Abused: No    Sexually Abused: No     PHYSICAL EXAM: Vitals:   07/30/24 1502  BP: 132/82  Pulse: 80  SpO2: 100%   General: No acute distress Head:  Normocephalic/atraumatic Skin/Extremities: No rash, no edema Neurological Exam: alert and awake. No aphasia or dysarthria. Fund of knowledge is appropriate. Attention and concentration are normal.   Cranial nerves: Pupils equal, round. Extraocular movements intact with no nystagmus. Visual fields full.  No facial asymmetry.  Motor: Bulk and tone normal, muscle strength 5/5 throughout with no pronator drift.   Finger to nose testing intact.  Gait narrow-based and steady, able to tandem walk adequately.  Romberg negative.   IMPRESSION: This is a 23 yo RH man with a history of depression with Primary Generalized Epilepsy. He had been seizure-free for over 3 years until  12/24/23 when he had several nonconvulsive seizures with report of staring followed by aggression. This occurred in the setting of binge drinking, alcohol level was very high. They deny any seizures since 11/2023, continue Clobazam  30mg  at bedtime and Depakote  500mg  BID. Energy levels improved with testosterone  injections,  however he continues to have difficulty with sleep initiation. He can try magnesium  glycinate, we also discussed improving sleep hygiene. He asked about sleeping aides, he was advised to speak to his PCP or see a sleep specialist. He is aware of [place] driving laws to stop driving after a seizure until 6 months seizure-free. Follow-up in 6 months, call for any changes.   Thank you for allowing me to participate in his care.  Please do not hesitate  to call for any questions or concerns.    Darice Shivers, M.D.   CC: Philippe Slade, NP

## 2024-08-05 ENCOUNTER — Other Ambulatory Visit: Payer: Self-pay

## 2024-08-05 MED ORDER — BD DISP NEEDLES 18G X 1-1/2" MISC
0 refills | Status: AC
  Filled 2024-08-05: qty 15, 90d supply, fill #0

## 2024-08-05 MED ORDER — TESTOSTERONE CYPIONATE 200 MG/ML IM SOLN
140.0000 mg | INTRAMUSCULAR | 2 refills | Status: AC
  Filled 2024-08-05: qty 4, 28d supply, fill #0
  Filled 2024-09-09: qty 4, 28d supply, fill #1
  Filled 2024-09-30 – 2024-10-04 (×2): qty 4, 28d supply, fill #2

## 2024-08-05 MED ORDER — BD DISP NEEDLES 22G X 1-1/2" MISC
0 refills | Status: AC
  Filled 2024-08-05: qty 15, 105d supply, fill #0

## 2024-08-06 ENCOUNTER — Other Ambulatory Visit: Payer: Self-pay

## 2024-08-06 ENCOUNTER — Ambulatory Visit: Admitting: Family Medicine

## 2024-08-06 ENCOUNTER — Other Ambulatory Visit (HOSPITAL_COMMUNITY): Payer: Self-pay

## 2024-08-06 ENCOUNTER — Encounter: Payer: Self-pay | Admitting: Family Medicine

## 2024-08-06 VITALS — BP 130/78 | HR 64 | Temp 98.2°F | Ht 72.0 in | Wt 207.0 lb

## 2024-08-06 DIAGNOSIS — G4709 Other insomnia: Secondary | ICD-10-CM

## 2024-08-06 MED ORDER — ESZOPICLONE 1 MG PO TABS
1.0000 mg | ORAL_TABLET | Freq: Every evening | ORAL | 0 refills | Status: DC | PRN
  Filled 2024-08-06: qty 15, 30d supply, fill #0

## 2024-08-06 NOTE — Patient Instructions (Addendum)
-  It was great to see you today. -Prescribed Eszopiclone (Lunesta) 1mg  for insomnia. Take 1 tablet at bedtime as needed for sleep. Provided general information about medication.  -This is a controlled substance. Ordered urine drug screen and will need to be collected periodically. Signed controlled substance contract that will need to be renewed yearly.  -Follow up in 1 month to assess progress with insomnia.

## 2024-08-06 NOTE — Progress Notes (Signed)
   Established Patient Office Visit   Subjective:  Patient ID: Albert Gomez, male    DOB: 08/08/2001  Age: 23 y.o. MRN: [id number]  Chief Complaint  Patient presents with   Insomnia    HPI: Patient is complaining of insomnia. He has previously tried Hydroxyzine , Trazodone , Ambien, and Quetiapine. Patient reports he has a problem going to sleep. He reports can't get his brain to stop thinking and sometimes he has problem with waking up.   He reports he has tried sleep hygiene and done sleep study, diagnosed with very mild obstructive sleep apnea.  Review of Systems  Psychiatric/Behavioral:  The patient has insomnia.    See HPI above     Objective:   BP 130/78   Pulse 64   Temp 98.2 F (36.8 C) (Oral)   Ht 6' (1.829 m)   Wt 207 lb (93.9 kg)   SpO2 99%   BMI 28.07 kg/m    Physical Exam Vitals reviewed.  Constitutional:      General: He is not in acute distress.    Appearance: Normal appearance. He is overweight. He is not ill-appearing, toxic-appearing or diaphoretic.  HENT:     Head: Normocephalic and atraumatic.  Eyes:     General:        Right eye: No discharge.        Left eye: No discharge.     Conjunctiva/sclera: Conjunctivae normal.  Cardiovascular:     Rate and Rhythm: Normal rate.  Pulmonary:     Effort: Pulmonary effort is normal. No respiratory distress.  Musculoskeletal:        General: Normal range of motion.  Skin:    General: Skin is warm and dry.  Neurological:     General: No focal deficit present.     Mental Status: He is alert and oriented to person, place, and time. Mental status is at baseline.  Psychiatric:        Mood and Affect: Mood normal.        Behavior: Behavior normal.        Thought Content: Thought content normal.        Judgment: Judgment normal.      Assessment & Plan:  Other insomnia -     Eszopiclone; Take 1 tablet (1 mg total) by mouth at bedtime as needed for sleep. Take immediately before bedtime  Dispense: 30  tablet; Refill: 0 -     DRUG MONITOR, PANEL 1, SCREEN, URINE  -Prescribed Eszopiclone (Lunesta) 1mg  for insomnia. Take 1 tablet at bedtime as needed for sleep. Provided general information about medication.  -This is a controlled substance. Ordered urine drug screen and will need to be collected periodically. Signed controlled substance contract that will need to be renewed yearly.  -Follow up in 1 month to assess progress with insomnia.   Return in about 1 month (around 09/05/2024) for follow-up.   Rhyder Bratz, NP

## 2024-08-07 ENCOUNTER — Ambulatory Visit: Payer: Self-pay | Admitting: Family Medicine

## 2024-08-07 LAB — DRUG MONITOR, PANEL 1, SCREEN, URINE
Amphetamines: NEGATIVE ng/mL (ref <500)
Barbiturates: NEGATIVE ng/mL (ref <300)
Benzodiazepines: POSITIVE ng/mL — AB (ref <100)
Cocaine Metabolite: NEGATIVE ng/mL (ref <150)
Creatinine: 197.4 mg/dL (ref >=20.0)
Marijuana Metabolite: POSITIVE ng/mL — AB (ref <20)
Methadone Metabolite: NEGATIVE ng/mL (ref <100)
Opiates: NEGATIVE ng/mL (ref <100)
Oxidant: NEGATIVE ug/mL (ref <200)
Oxycodone: NEGATIVE ng/mL (ref <100)
Phencyclidine: NEGATIVE ng/mL (ref <25)
pH: 7.4 (ref 4.5–9.0)

## 2024-08-07 LAB — DM TEMPLATE

## 2024-08-14 ENCOUNTER — Other Ambulatory Visit: Payer: Self-pay

## 2024-08-14 ENCOUNTER — Encounter: Payer: Self-pay | Admitting: Neurology

## 2024-08-21 ENCOUNTER — Other Ambulatory Visit (HOSPITAL_COMMUNITY): Payer: Self-pay

## 2024-09-05 ENCOUNTER — Ambulatory Visit (INDEPENDENT_AMBULATORY_CARE_PROVIDER_SITE_OTHER): Admitting: Family Medicine

## 2024-09-05 ENCOUNTER — Encounter: Payer: Self-pay | Admitting: Family Medicine

## 2024-09-05 ENCOUNTER — Other Ambulatory Visit: Payer: Self-pay

## 2024-09-05 ENCOUNTER — Other Ambulatory Visit (HOSPITAL_COMMUNITY): Payer: Self-pay

## 2024-09-05 VITALS — BP 126/80 | HR 70 | Temp 98.0°F | Ht 72.0 in | Wt 210.0 lb

## 2024-09-05 DIAGNOSIS — G4709 Other insomnia: Secondary | ICD-10-CM

## 2024-09-05 MED ORDER — ESZOPICLONE 3 MG PO TABS
3.0000 mg | ORAL_TABLET | Freq: Every day | ORAL | 0 refills | Status: DC
  Filled 2024-09-05: qty 15, 30d supply, fill #0

## 2024-09-05 NOTE — Assessment & Plan Note (Signed)
-  Eszopiclone  (Lunesta ) 1mg  tablet at bedtime didn't help his insomnia. Recommend to increase medication to 3 mg tablet at bedtime for 1 month. Last month he completed a UDS and controlled substance contract. PDMP reviewed. Last refill was on 11/26.  -Follow up in 1 month to evaluate change.

## 2024-09-05 NOTE — Progress Notes (Signed)
° °  Established Patient Office Visit   Subjective:  Patient ID: Albert Gomez, male    DOB: [birth year]  Age: 23 y.o. MRN: [id number]  Chief Complaint  Patient presents with   Medical Management of Chronic Issues     1 month follow up     HPI Patient is following up on insomnia. He has previously failed Hydroxyzine , Trazodone , Ambien, and Quetiapine. On previous visit, started on Eszopiclone  (Lunesta ) 1mg  for insomnia. He reports he didn't seem to notice that it helped. He reports if he noticed anything, he reports it may have helped stay a sleep. It didn't help much with falling a sleep.  ROS See HPI above     Objective:   BP 126/80   Pulse 70   Temp 98 F (36.7 C) (Oral)   Ht 6' (1.829 m)   Wt 210 lb (95.3 kg)   SpO2 98%   BMI 28.48 kg/m     Physical Exam Vitals reviewed.  Constitutional:      General: He is not in acute distress.    Appearance: Normal appearance. He is not ill-appearing, toxic-appearing or diaphoretic.  Eyes:     General:        Right eye: No discharge.        Left eye: No discharge.     Conjunctiva/sclera: Conjunctivae normal.  Cardiovascular:     Rate and Rhythm: Normal rate.  Pulmonary:     Effort: Pulmonary effort is normal. No respiratory distress.  Musculoskeletal:        General: Normal range of motion.  Skin:    General: Skin is warm and dry.  Neurological:     General: No focal deficit present.     Mental Status: He is alert and oriented to person, place, and time. Mental status is at baseline.  Psychiatric:        Mood and Affect: Mood normal.        Behavior: Behavior normal.        Thought Content: Thought content normal.        Judgment: Judgment normal.     Assessment & Plan:  Other insomnia Assessment & Plan: -Eszopiclone  (Lunesta ) 1mg  tablet at bedtime didn't help his insomnia. Recommend to increase medication to 3 mg tablet at bedtime for 1 month. Last month he completed a UDS and controlled substance contract. PDMP  reviewed. Last refill was on 11/26.  -Follow up in 1 month to evaluate change.  Orders: -     Eszopiclone ; Take 1 tablet (3 mg total) by mouth at bedtime. Take immediately before bedtime  Dispense: 30 tablet; Refill: 0   Return in about 1 month (around 10/06/2024) for follow-up-in person .   Tashona Calk, NP

## 2024-09-05 NOTE — Patient Instructions (Signed)
-  Eszopiclone  (Lunesta ) 1mg  tablet at bedtime didn't help your insomnia. Recommend to increase medication to 3 mg tablet at bedtime for 1 month. -Follow up in 1 month to evaluate change. Neomia Christmas!

## 2024-09-09 ENCOUNTER — Other Ambulatory Visit: Payer: Self-pay

## 2024-09-14 ENCOUNTER — Encounter (HOSPITAL_BASED_OUTPATIENT_CLINIC_OR_DEPARTMENT_OTHER): Payer: Self-pay

## 2024-09-14 ENCOUNTER — Emergency Department (HOSPITAL_BASED_OUTPATIENT_CLINIC_OR_DEPARTMENT_OTHER)
Admission: EM | Admit: 2024-09-14 | Discharge: 2024-09-14 | Disposition: A | Discharge status: Home / Self Care | Attending: Emergency Medicine | Admitting: Emergency Medicine

## 2024-09-14 ENCOUNTER — Emergency Department (HOSPITAL_BASED_OUTPATIENT_CLINIC_OR_DEPARTMENT_OTHER): Admitting: Radiology

## 2024-09-14 ENCOUNTER — Other Ambulatory Visit: Payer: Self-pay

## 2024-09-14 DIAGNOSIS — R509 Fever, unspecified: Secondary | ICD-10-CM | POA: Diagnosis present

## 2024-09-14 DIAGNOSIS — B349 Viral infection, unspecified: Secondary | ICD-10-CM | POA: Insufficient documentation

## 2024-09-14 LAB — RESP PANEL BY RT-PCR (RSV, FLU A&B, COVID)  RVPGX2
Influenza A by PCR: NEGATIVE
Influenza B by PCR: NEGATIVE
Resp Syncytial Virus by PCR: NEGATIVE
SARS Coronavirus 2 by RT PCR: NEGATIVE

## 2024-09-14 MED ORDER — IBUPROFEN 400 MG PO TABS
400.0000 mg | ORAL_TABLET | Freq: Once | ORAL | Status: AC
  Administered 2024-09-14: 400 mg via ORAL
  Filled 2024-09-14: qty 1

## 2024-09-14 MED ORDER — ACETAMINOPHEN 325 MG PO TABS
650.0000 mg | ORAL_TABLET | Freq: Once | ORAL | Status: AC | PRN
  Administered 2024-09-14: 650 mg via ORAL
  Filled 2024-09-14: qty 2

## 2024-09-14 MED ORDER — ONDANSETRON 4 MG PO TBDP
4.0000 mg | ORAL_TABLET | Freq: Once | ORAL | Status: AC
  Administered 2024-09-14: 4 mg via ORAL
  Filled 2024-09-14: qty 1

## 2024-09-14 MED ORDER — ONDANSETRON 4 MG PO TBDP: 4.0000 mg | ORAL_TABLET | Freq: Three times a day (TID) | ORAL | 0 refills | Status: AC | PRN

## 2024-09-14 NOTE — Discharge Instructions (Addendum)
 Tylenol every 4 hours

## 2024-09-14 NOTE — ED Provider Notes (Signed)
 " Albert Gomez Provider Note   CSN: [id number] Arrival date & time: 09/14/24  1826     Patient presents with: Fever   Albert Gomez is a 23 y.o. male.   Patient complains of a cough and congestion.  Patient has had symptoms over the past 2 days.  Patient reports he has had fever and chills.  He feels achy all over.  Patient denies any shortness of breath.  Patient has a past medical history of a seizure disorder.  Patient reports he is taking his seizure medications.  The history is provided by the patient. No language interpreter was used.  Fever      Prior to Admission medications  Medication Sig Start Date End Date Taking? Authorizing Provider  ondansetron  (ZOFRAN -ODT) 4 MG disintegrating tablet Take 1 tablet (4 mg total) by mouth every 8 (eight) hours as needed for nausea or vomiting. 09/14/24  Yes Flint Raring K, PA-C  cloBAZam  (ONFI ) 20 MG tablet Take 1 and 1/2  tablets (30 mg total) by mouth at night. 07/30/24   Georjean Darice HERO, MD  diazePAM , 20 MG Dose, (VALTOCO  20 MG DOSE) 2 x 10 MG/0.1ML LQPK Administer one spray in one nostril, second spray in other nostril (one dose) as needed for seizure. May use second dose in 4 hours if needed. 02/20/24   Georjean Darice HERO, MD  diazePAM , 20 MG Dose, (VALTOCO  20 MG DOSE) 2 x 10 MG/0.1ML LQPK Administer one spray in one nostril, second spray in other nostril (one dose) as needed for seizure. May use second dose in 4 hours if needed. 04/29/24   Leigh Venetia CROME, MD  divalproex  (DEPAKOTE ) 500 MG DR tablet Take 1 tablet (500 mg total) by mouth 2 (two) times daily. 07/30/24   Georjean Darice HERO, MD  Eszopiclone  3 MG TABS Take 1 tablet (3 mg total) by mouth at bedtime. Take immediately before bedtime 09/05/24 10/09/24  Billy Philippe SAUNDERS, NP  NEEDLE, DISP, 18 G (BD DISP NEEDLES) 18G X 1-1/2 MISC USE AS DIRECTED 08/05/24     NEEDLE, DISP, 22 G (BD DISP NEEDLES) 22G X 1-1/2 MISC Use to administer  testosterone  as directed. 08/05/24     sertraline  (ZOLOFT ) 100 MG tablet Take 1 tablet (100 mg total) by mouth daily. 03/26/24   Billy Philippe SAUNDERS, NP  testosterone  cypionate (DEPOTESTOSTERONE CYPIONATE) 200 MG/ML injection Inject 0.7 mLs (140 mg total) into the muscle once a week. 08/05/24     testosterone  cypionate (DEPOTESTOTERONE CYPIONATE) 100 MG/ML injection Inject into the muscle once a week. For IM use only    [provider]    Allergies: Patient has no known allergies.    Review of Systems  Constitutional:  Positive for fever.  All other systems reviewed and are negative.   Updated Vital Signs BP (!) 128/50 (BP Location: Right Arm)   Pulse 91   Temp 99.8 F (37.7 C) (Oral)   Resp 16   Ht 6' (1.829 m)   Wt 95.3 kg   SpO2 99%   BMI 28.48 kg/m   Physical Exam Vitals and nursing note reviewed.  Constitutional:      Appearance: He is well-developed.  HENT:     Head: Normocephalic.     Right Ear: Tympanic membrane normal.     Left Ear: Tympanic membrane normal.     Mouth/Throat:     Mouth: Mucous membranes are moist.  Eyes:     Extraocular Movements: Extraocular movements intact.  Pupils: Pupils are equal, round, and reactive to light.  Cardiovascular:     Rate and Rhythm: Normal rate.  Pulmonary:     Effort: Pulmonary effort is normal.  Abdominal:     General: There is no distension.  Musculoskeletal:        General: Normal range of motion.     Cervical back: Normal range of motion.  Skin:    General: Skin is warm.  Neurological:     General: No focal deficit present.     Mental Status: He is alert and oriented to person, place, and time.  Psychiatric:        Mood and Affect: Mood normal.     (all labs ordered are listed, but only abnormal results are displayed) Labs Reviewed  RESP PANEL BY RT-PCR (RSV, FLU A&B, COVID)  [id number]    EKG: None  Radiology: DG Chest 2 View Result Date: 09/14/2024 EXAM: 2 VIEW(S) XRAY OF THE CHEST  09/14/2024 09:34:50 PM COMPARISON: 12/24/2023 CLINICAL HISTORY: cough FINDINGS: LUNGS AND PLEURA: No focal pulmonary opacity. No pleural effusion. No pneumothorax. HEART AND MEDIASTINUM: No acute abnormality of the cardiac and mediastinal silhouettes. BONES AND SOFT TISSUES: No acute osseous abnormality. IMPRESSION: 1. No acute process. Electronically signed by: Norman Gatlin MD 09/14/2024 09:46 PM EST RP Workstation: [id number]     Procedures   Medications Ordered in the ED  acetaminophen  (TYLENOL ) tablet 650 mg (650 mg Oral Given 09/14/24 1835)  ondansetron  (ZOFRAN -ODT) disintegrating tablet 4 mg (4 mg Oral Given 09/14/24 2205)  ibuprofen  (ADVIL ) tablet 400 mg (400 mg Oral Given 09/14/24 2205)                                    Medical Decision Making Patient complains of cough congestion fever and chills  Amount and/or Complexity of Data Reviewed Independent Historian: spouse    Details: Patient is here with his spouse who is supportive Labs: ordered. Decision-making details documented in ED Course.    Details: Influenza COVID and RSV are negative Radiology: ordered and independent interpretation performed. Decision-making details documented in ED Course.    Details: Chest x-ray shows no evidence of pneumonia  Risk OTC drugs. Prescription drug management. Risk Details: And is counseled on probable viral illness.  Patient is advised to take Tylenol  or ibuprofen  for discomfort.  Patient request a prescription of Zofran  for nausea.  Patient is advised to follow-up with his primary care physician if symptoms persist past 1 week.        Final diagnoses:  Viral illness    ED Discharge Orders          Ordered    ondansetron  (ZOFRAN -ODT) 4 MG disintegrating tablet  Every 8 hours PRN        09/14/24 2205            An After Visit Summary was printed and given to the patient.    Flint Sonny POUR, [place] 09/14/24 2342  "

## 2024-09-14 NOTE — ED Triage Notes (Signed)
 Pt reports flu like S/S x2 days.

## 2024-09-16 ENCOUNTER — Ambulatory Visit: Payer: Self-pay

## 2024-09-16 NOTE — Telephone Encounter (Signed)
 FYI Only or Action Required?: FYI only for provider: appointment scheduled on 09/17/2024 at 8 AM.  Patient was last seen in primary care on 09/05/2024 by Billy Philippe SAUNDERS, NP.  Called Nurse Triage reporting Shortness of Breath and Cough.  Symptoms began several days ago.  Interventions attempted: Rest, hydration, or home remedies.  Symptoms are: unchanged.  Triage Disposition: See Physician Within 24 Hours (overriding See HCP Within 4 Hours (Or PCP Triage))  Patient/caregiver understands and will follow disposition?: Yes  Copied from CRM #[id number]. Topic: Clinical - Red Word Triage >> Sep 16, 2024  8:20 AM Aleatha C wrote: Red Word that prompted transfer to Nurse Triage: Santina to urgent care still running a fever of 101 and throwing up Reason for Disposition  Continuous (nonstop) coughing interferes with work, school, or sleeping  Answer Assessment - Initial Assessment Questions 1. RESPIRATORY STATUS: Describe your breathing? (e.g., wheezing, shortness of breath, unable to speak, severe coughing)      Shortness of breath 2. ONSET: When did this breathing problem begin?      Started on Friday 3. PATTERN Does the difficult breathing come and go, or has it been constant since it started?      constant 4. SEVERITY: How bad is your breathing? (e.g., mild, moderate, severe)      mild 5. RECURRENT SYMPTOM: Have you had difficulty breathing before? If Yes, ask: When was the last time? and What happened that time?      no 6. CARDIAC HISTORY: Do you have any history of heart disease? (e.g., heart attack, angina, bypass surgery, angioplasty)      no 7. LUNG HISTORY: Do you have any history of lung disease?  (e.g., pulmonary embolus, asthma, emphysema)     no 8. CAUSE: What do you think is causing the breathing problem?      unsure 9. OTHER SYMPTOMS: Do you have any other symptoms? (e.g., chest pain, cough, dizziness, fever, runny nose)     Chest tightness, cough,  fever (the last couple of days) 10. O2 SATURATION MONITOR:  Do you use an oxygen saturation monitor (pulse oximeter) at home? If Yes, ask: What is your reading (oxygen level) today? What is your usual oxygen saturation reading? (e.g., 95%)       NA 12. TRAVEL: Have you traveled out of the country in the last month? (e.g., travel history, exposures)       no  Protocols used: Breathing Difficulty-A-AH

## 2024-09-17 ENCOUNTER — Ambulatory Visit: Admitting: Family Medicine

## 2024-09-17 ENCOUNTER — Other Ambulatory Visit (HOSPITAL_COMMUNITY): Payer: Self-pay

## 2024-09-17 ENCOUNTER — Encounter: Payer: Self-pay | Admitting: Family Medicine

## 2024-09-17 ENCOUNTER — Other Ambulatory Visit: Payer: Self-pay

## 2024-09-17 VITALS — BP 118/70 | HR 72 | Temp 97.9°F | Resp 16 | Ht 72.0 in | Wt 209.4 lb

## 2024-09-17 DIAGNOSIS — J989 Respiratory disorder, unspecified: Secondary | ICD-10-CM | POA: Diagnosis not present

## 2024-09-17 DIAGNOSIS — J069 Acute upper respiratory infection, unspecified: Secondary | ICD-10-CM

## 2024-09-17 DIAGNOSIS — R0602 Shortness of breath: Secondary | ICD-10-CM | POA: Diagnosis not present

## 2024-09-17 MED ORDER — FLUTICASONE PROPIONATE 50 MCG/ACT NA SUSP
1.0000 | Freq: Two times a day (BID) | NASAL | 0 refills | Status: AC
  Filled 2024-09-17: qty 16, 30d supply, fill #0

## 2024-09-17 MED ORDER — ALBUTEROL SULFATE (2.5 MG/3ML) 0.083% IN NEBU
2.5000 mg | INHALATION_SOLUTION | Freq: Once | RESPIRATORY_TRACT | Status: AC
  Administered 2024-09-17: 2.5 mg via RESPIRATORY_TRACT

## 2024-09-17 MED ORDER — IPRATROPIUM-ALBUTEROL 0.5-2.5 (3) MG/3ML IN SOLN: 3.0000 mL | Freq: Once | RESPIRATORY_TRACT | Status: DC

## 2024-09-17 MED ORDER — PREDNISONE 20 MG PO TABS
40.0000 mg | ORAL_TABLET | Freq: Every day | ORAL | 0 refills | Status: AC
  Filled 2024-09-17: qty 10, 5d supply, fill #0

## 2024-09-17 MED ORDER — ALBUTEROL SULFATE HFA 108 (90 BASE) MCG/ACT IN AERS
2.0000 | INHALATION_SPRAY | Freq: Four times a day (QID) | RESPIRATORY_TRACT | 0 refills | Status: AC | PRN
  Filled 2024-09-17: qty 6.7, 25d supply, fill #0

## 2024-09-17 NOTE — Progress Notes (Signed)
 "  ACUTE VISIT Chief Complaint  Patient presents with   Shortness of Breath   Discussed the use of AI scribe software for clinical note transcription with the patient, who gave verbal consent to proceed.  History of Present Illness Albert Gomez Gomez is Albert Gomez 23 year old male with past medical history significant for generalized epilepsy, and anxiety/depression here today c/o shortness of breath.  He has been experiencing shortness of breath since December 18, which worsens with physical activity such as walking or more intense activities, and is less severe when sitting still. He has no history of asthma. Nasal congestion, rhinorrhea, postnasal drainage. Occasional productive cough with clear sputum, negative for hemoptysis. He has not noted wheezing. 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 He has not traveled recently. He reports fever and chills, with Albert Gomez recent fever recorded at 102.34F during an emergency department visit on December 20.  Tests for flu and COVID-19 were negative.  He feels feverish at times, but ibuprofen  helps alleviate the feverish feeling and chills. He has not checked temp at home. He has also been taking Mucinex.  He is Albert Gomez non-smoker and no history of asthma. He experiences nausea sometimes. He reports Albert Gomez history of heartburn, which he manages with occasional Tums. .  Review of Systems  Constitutional:  Positive for fatigue. Negative for chills and unexpected weight change.  HENT:  Positive for congestion, postnasal drip and rhinorrhea. Negative for sore throat.   Eyes:  Negative for discharge and redness.  Respiratory:  Negative for stridor.   Cardiovascular:  Negative for chest pain.  Gastrointestinal:  Negative for abdominal pain, nausea and vomiting.  Genitourinary:  Negative for decreased urine volume, dysuria and hematuria.  Musculoskeletal:  Negative for gait problem and joint swelling.  Neurological:   Negative for syncope and weakness.  Psychiatric/Behavioral:  Negative for confusion and hallucinations.   See other pertinent positives and negatives in HPI.  Medications Ordered Prior to Encounter[1]  Past Medical History:  Diagnosis Date   Anxiety    Concussion    Depression    Epilepsy (HCC)    Obsessive-compulsive disorder    Sleep disorder    Vomiting    Allergies[2]  Social History   Socioeconomic History   Marital status: Married    Spouse name: Not on file   Number of children: 0   Years of education: Not on file   Highest education level: Some college, no degree  Occupational History   Not on file  Tobacco Use   Smoking status: Never   Smokeless tobacco: Current    Types: Chew  Vaping Use   Vaping status: Former  Substance and Sexual Activity   Alcohol use: Yes    Comment: Once Albert Gomez week .   Drug use: Yes    Types: Marijuana   Sexual activity: Yes    Partners: Female    Birth control/protection: Condom, Other-see comments    Comment: does not use protection every time  Other Topics Concern   Not on file  Social History Narrative   Not on file   Social Drivers of Health   Tobacco Use: High Risk (09/17/2024)   Patient History    Smoking Tobacco Use: Never    Smokeless Tobacco Use: Current    Passive Exposure: Not on file  Financial Resource Strain: Low Risk (03/26/2024)   Overall Financial Resource Strain (CARDIA)    Difficulty of Paying Living Expenses: Not hard at all  Food Insecurity: No Food Insecurity (03/26/2024)   Epic    Worried About Programme Researcher, Broadcasting/film/video in the Last Year: Never true    Ran Out of Food in the Last Year: Never true  Transportation Needs: No Transportation Needs (03/26/2024)   Epic    Lack of Transportation (Medical): No    Lack of Transportation (Non-Medical): No  Physical Activity: Sufficiently Active (03/26/2024)   Exercise Vital Sign    Days of Exercise per Week: 5 days    Minutes of Exercise per Session: 50 min  Stress: No  Stress Concern Present (03/26/2024)   [employer] of Occupational Health - Occupational Stress Questionnaire    Feeling of Stress: Not at all  Social Connections: Moderately Isolated (03/26/2024)   Social Connection and Isolation Panel    Frequency of Communication with Friends and Family: More than three times Albert Gomez week    Frequency of Social Gatherings with Friends and Family: Twice Albert Gomez week    Attends Religious Services: Never    Database Administrator or Organizations: No    Attends Banker Meetings: Never    Marital Status: Living with partner  Depression (PHQ2-9): Medium Risk (09/05/2024)   Depression (PHQ2-9)    PHQ-2 Score: 5  Alcohol Screen: Low Risk (03/26/2024)   Alcohol Screen    Last Alcohol Screening Score (AUDIT): 1  Housing: Low Risk (03/26/2024)   Epic    Unable to Pay for Housing in the Last Year: No    Number of Times Moved in the Last Year: 0    Homeless in the Last Year: No  Utilities: Not At Risk (03/26/2024)   Epic    Threatened with loss of utilities: No  Health Literacy: Adequate Health Literacy (03/26/2024)   B1300 Health Literacy    Frequency of need for help with medical instructions: Never   Vitals:   09/17/24 0806  BP: 118/70  Pulse: 72  Resp: 16  Temp: 97.9 F (36.6 C)  SpO2: 97%   Body mass index is 28.4 kg/m.  Physical Exam Vitals and nursing note reviewed.  Constitutional:      General: He is not in acute distress.    Appearance: He is well-developed. He is not ill-appearing.  HENT:     Head: Normocephalic and atraumatic.     Right Ear: Tympanic membrane, ear canal and external ear normal.     Left Ear: Tympanic membrane, ear canal and external ear normal.     Nose: Rhinorrhea present.     Right Sinus: No maxillary sinus tenderness or frontal sinus tenderness.     Left Sinus: No maxillary sinus tenderness or frontal sinus tenderness.  Eyes:     Conjunctiva/sclera: Conjunctivae normal.  Cardiovascular:     Rate and Rhythm:  Normal rate and regular rhythm.     Heart sounds: No murmur heard. Pulmonary:     Effort: Pulmonary effort is normal. No respiratory distress.     Breath sounds: No stridor. Wheezing present. No decreased breath sounds, rhonchi or rales.  Lymphadenopathy:     Head:     Right side of head: No submandibular adenopathy.     Left side of head: No submandibular adenopathy.     Cervical: No cervical adenopathy.  Skin:    General: Skin is warm.     Findings: No erythema or rash.  Neurological:     General: No focal deficit present.     Mental Status: He is alert and oriented to person, place,  and time.  Psychiatric:        Mood and Affect: Mood and affect normal.    ASSESSMENT AND PLAN:  Kiley was seen today for shortness of breath.  Diagnoses and all orders for this visit:  SOB (shortness of breath) We discussed possible etiologies, most likely related to recent viral illness.  Wheezing noted on auscultation. I do not think further workup is needed at this time. He was clearly instructed about warning signs.  -     predniSONE  (DELTASONE ) 20 MG tablet; Take 2 tablets (40 mg total) by mouth daily with breakfast for 5 days. -     albuterol  (VENTOLIN  HFA) 108 (90 Base) MCG/ACT inhaler; Inhale 2 puffs into the lungs every 6 (six) hours as needed for wheezing or shortness of breath.  URI, acute Evaluated in the ED on 09/14/2024, respiratory panel negative. Symptomatic treatment to continue. Adequate hydration. Recommend Flonase  nasal spray at bedtime for 10 to 14 days to help with nasal congestion then as needed. Instructed to monitor temperature. Explained that cough and congestion can last Albert Gomez few more days and even weeks after acute symptoms have resolved.  -     fluticasone  (FLONASE ) 50 MCG/ACT nasal spray; Place 1 spray into both nostrils 2 (two) times daily.  Reactive airway disease without asthma Diffuse wheezing noted on auscultation. Here in the office and after discussion of  side effects, he gave verbal consent to receive albuterol  neb treatment. He tolerated treatment well.  Auscultation after breathing treatment was negative for rales and rhonchi, wheezing is still present but greatly improved. Instructed to continue with albuterol  1 to 2 puff every 6 hours for 1 week then as needed and prednisone  40 mg daily with breakfast for 3 to 5 days. We discussed side effects of medications. Instructed about warning signs. Follow-up with PCP as needed.  -     predniSONE  (DELTASONE ) 20 MG tablet; Take 2 tablets (40 mg total) by mouth daily with breakfast for 5 days. -     albuterol  (VENTOLIN  HFA) 108 (90 Base) MCG/ACT inhaler; Inhale 2 puffs into the lungs every 6 (six) hours as needed for wheezing or shortness of breath.  Return in about 10 days (around 09/27/2024), or if symptoms worsen or fail to improve.  Kristine Tiley G. Kiondre Grenz, MD  [facility] [place] office.     [1]  Current Outpatient Medications on File Prior to Visit  Medication Sig Dispense Refill   cloBAZam  (ONFI ) 20 MG tablet Take 1 and 1/2  tablets (30 mg total) by mouth at night. 135 tablet 4   diazePAM , 20 MG Dose, (VALTOCO  20 MG DOSE) 2 x 10 MG/0.1ML LQPK Administer one spray in one nostril, second spray in other nostril (one dose) as needed for seizure. May use second dose in 4 hours if needed. 10 each 5   diazePAM , 20 MG Dose, (VALTOCO  20 MG DOSE) 2 x 10 MG/0.1ML LQPK Administer one spray in one nostril, second spray in other nostril (one dose) as needed for seizure. May use second dose in 4 hours if needed. 5 each 5   divalproex  (DEPAKOTE ) 500 MG DR tablet Take 1 tablet (500 mg total) by mouth 2 (two) times daily. 180 tablet 3   Eszopiclone  3 MG TABS Take 1 tablet (3 mg total) by mouth at bedtime. Take immediately before bedtime 30 tablet 0   NEEDLE, DISP, 18 G (BD DISP NEEDLES) 18G X 1-1/2 MISC USE AS DIRECTED 15 each 0   NEEDLE, DISP, 22 G (  BD DISP NEEDLES) 22G X 1-1/2 MISC Use to administer  testosterone  as directed. 15 each 0   ondansetron  (ZOFRAN -ODT) 4 MG disintegrating tablet Take 1 tablet (4 mg total) by mouth every 8 (eight) hours as needed for nausea or vomiting. 20 tablet 0   sertraline  (ZOLOFT ) 100 MG tablet Take 1 tablet (100 mg total) by mouth daily. 90 tablet 1   testosterone  cypionate (DEPOTESTOSTERONE CYPIONATE) 200 MG/ML injection Inject 0.7 mLs (140 mg total) into the muscle once Albert Gomez week. 4 mL 2   testosterone  cypionate (DEPOTESTOTERONE CYPIONATE) 100 MG/ML injection Inject into the muscle once Albert Gomez week. For IM use only     No current facility-administered medications on file prior to visit.  [2] No Known Allergies  "

## 2024-09-17 NOTE — Patient Instructions (Addendum)
 A few things to remember from today's visit:  SOB (shortness of breath) - Plan: predniSONE  (DELTASONE ) 20 MG tablet, albuterol  (VENTOLIN  HFA) 108 (90 Base) MCG/ACT inhaler  Reactive airway disease without asthma - Plan: predniSONE  (DELTASONE ) 20 MG tablet, albuterol  (VENTOLIN  HFA) 108 (90 Base) MCG/ACT inhaler  URI, acute Here in the office you received a breathing treatment with albuterol .  In about 6 hours you can start albuterol  inhaler 1 to 2 puff every 6 hours for 1 week and then as needed for wheezing or shortness of breath. Prednisone  to take with breakfast for 3 to 5 days. Monitor temperature. Cough and congestion can last a few more days and even weeks after an acute viral illness. Please follow-up with your PCP in 10 days if you are not feeling any better, before if symptoms get worse.  If you need refills for medications you take chronically, please call your pharmacy. Do not use My Chart to request refills or for acute issues that need immediate attention. If you send a my chart message, it may take a few days to be addressed, specially if I am not in the office.  Please be sure medication list is accurate. If a new problem present, please set up appointment sooner than planned today.

## 2024-09-29 ENCOUNTER — Other Ambulatory Visit: Payer: Self-pay | Admitting: Family Medicine

## 2024-09-29 DIAGNOSIS — F411 Generalized anxiety disorder: Secondary | ICD-10-CM

## 2024-09-30 ENCOUNTER — Other Ambulatory Visit: Payer: Self-pay

## 2024-09-30 ENCOUNTER — Other Ambulatory Visit (HOSPITAL_COMMUNITY): Payer: Self-pay

## 2024-09-30 MED ORDER — SERTRALINE HCL 100 MG PO TABS
100.0000 mg | ORAL_TABLET | Freq: Every day | ORAL | 1 refills | Status: AC
  Filled 2024-09-30: qty 90, 90d supply, fill #0

## 2024-10-01 ENCOUNTER — Other Ambulatory Visit (HOSPITAL_COMMUNITY): Payer: Self-pay

## 2024-10-04 ENCOUNTER — Other Ambulatory Visit: Payer: Self-pay

## 2024-10-07 ENCOUNTER — Ambulatory Visit: Admitting: Family Medicine

## 2024-10-07 ENCOUNTER — Other Ambulatory Visit (HOSPITAL_COMMUNITY): Payer: Self-pay

## 2024-10-11 ENCOUNTER — Other Ambulatory Visit (HOSPITAL_COMMUNITY): Payer: Self-pay

## 2024-10-11 ENCOUNTER — Encounter: Payer: Self-pay | Admitting: Family Medicine

## 2024-10-11 ENCOUNTER — Ambulatory Visit: Admitting: Family Medicine

## 2024-10-11 VITALS — BP 120/82 | HR 73 | Temp 98.3°F | Ht 72.0 in | Wt 205.0 lb

## 2024-10-11 DIAGNOSIS — G4709 Other insomnia: Secondary | ICD-10-CM

## 2024-10-11 MED ORDER — ZOLPIDEM TARTRATE 5 MG PO TABS
5.0000 mg | ORAL_TABLET | Freq: Every evening | ORAL | 0 refills | Status: AC | PRN
  Filled 2024-10-11: qty 15, 30d supply, fill #0

## 2024-10-11 NOTE — Patient Instructions (Addendum)
-  It was good to see you today.  -Discontinued Eszopiclone .  -Will retry Ambien  5mg  daily at bedtime.  -Discussed if this is not effective, will increase to Ambien  to 10mg  daily at bedtime. If this is not effective, a referral to sleep med.

## 2024-10-11 NOTE — Progress Notes (Signed)
" ° °  Established Patient Office Visit   Subjective:  Patient ID: Albert Gomez, male    DOB: Mar 24, 2001  Age: 24 y.o. MRN: [id number]  Chief Complaint  Patient presents with   Medical Management of Chronic Issues    1 month follow up     HPI Patient is following up on insomnia. He has previously failed Hydroxyzine , Trazodone , Ambien , and Quetiapine. On previous visit, increased Eszopiclone  to 3mg  but has not been effective. He reports he can not fall a sleep, but once he gets to sleep he can not stay a sleep.  ROS See HPI above     Objective:   BP 120/82   Pulse 73   Temp 98.3 F (36.8 C) (Oral)   Ht 6' (1.829 m)   Wt 205 lb (93 kg)   SpO2 98%   BMI 27.80 kg/m    Physical Exam Vitals reviewed.  Constitutional:      General: He is not in acute distress.    Appearance: Normal appearance. He is overweight. He is not ill-appearing, toxic-appearing or diaphoretic.  Eyes:     General:        Right eye: No discharge.        Left eye: No discharge.     Conjunctiva/sclera: Conjunctivae normal.  Cardiovascular:     Rate and Rhythm: Normal rate.  Pulmonary:     Effort: Pulmonary effort is normal. No respiratory distress.     Breath sounds: Normal breath sounds.  Musculoskeletal:        General: Normal range of motion.  Skin:    General: Skin is warm and dry.  Neurological:     General: No focal deficit present.     Mental Status: He is alert and oriented to person, place, and time. Mental status is at baseline.  Psychiatric:        Mood and Affect: Mood normal.        Behavior: Behavior normal.        Thought Content: Thought content normal.        Judgment: Judgment normal.      Assessment & Plan:  Other insomnia -     Zolpidem  Tartrate; Take 1 tablet (5 mg total) by mouth at bedtime as needed for sleep.  Dispense: 30 tablet; Refill: 0  -Discontinued Eszopiclone .  -Will retry Ambien  5mg  daily at bedtime. Patient would like to retry Ambien . He reports he was 24  years old when he tried it and only tried it for a week. ime. If this is not effective, a referral to sleep med.   Return in about 1 month (around 11/11/2024) for follow-up.   Keir Foland, NP "

## 2024-10-12 ENCOUNTER — Other Ambulatory Visit (HOSPITAL_COMMUNITY): Payer: Self-pay

## 2024-10-19 ENCOUNTER — Other Ambulatory Visit (HOSPITAL_COMMUNITY): Payer: Self-pay

## 2024-10-19 ENCOUNTER — Other Ambulatory Visit: Payer: Self-pay | Admitting: Neurology

## 2024-11-11 ENCOUNTER — Ambulatory Visit: Admitting: Family Medicine

## 2025-02-26 ENCOUNTER — Ambulatory Visit: Admitting: Neurology
